# Patient Record
Sex: Female | Born: 1975 | Race: Black or African American | Hispanic: No | Marital: Married | Smoking: Former smoker
Health system: Southern US, Community
[De-identification: ages and names within clinical notes are randomized; demographics above are authoritative.]

## PROBLEM LIST (undated history)

## (undated) ENCOUNTER — Inpatient Hospital Stay (HOSPITAL_COMMUNITY): Payer: Self-pay

## (undated) DIAGNOSIS — D219 Benign neoplasm of connective and other soft tissue, unspecified: Secondary | ICD-10-CM

## (undated) DIAGNOSIS — E079 Disorder of thyroid, unspecified: Secondary | ICD-10-CM

## (undated) DIAGNOSIS — R011 Cardiac murmur, unspecified: Secondary | ICD-10-CM

## (undated) DIAGNOSIS — I10 Essential (primary) hypertension: Secondary | ICD-10-CM

## (undated) HISTORY — PX: NO PAST SURGERIES: SHX2092

---

## 2001-09-12 ENCOUNTER — Other Ambulatory Visit: Admission: RE | Admit: 2001-09-12 | Discharge: 2001-09-12 | Payer: Self-pay | Admitting: Obstetrics and Gynecology

## 2002-09-03 ENCOUNTER — Other Ambulatory Visit: Admission: RE | Admit: 2002-09-03 | Discharge: 2002-09-03 | Payer: Self-pay | Admitting: Obstetrics and Gynecology

## 2003-09-08 ENCOUNTER — Other Ambulatory Visit: Admission: RE | Admit: 2003-09-08 | Discharge: 2003-09-08 | Payer: Self-pay | Admitting: Obstetrics and Gynecology

## 2004-06-21 ENCOUNTER — Emergency Department (HOSPITAL_COMMUNITY): Admission: EM | Admit: 2004-06-21 | Discharge: 2004-06-21 | Payer: Self-pay | Admitting: Emergency Medicine

## 2004-11-17 ENCOUNTER — Other Ambulatory Visit: Admission: RE | Admit: 2004-11-17 | Discharge: 2004-11-17 | Payer: Self-pay | Admitting: Obstetrics and Gynecology

## 2011-08-16 ENCOUNTER — Emergency Department (HOSPITAL_COMMUNITY)
Admission: EM | Admit: 2011-08-16 | Discharge: 2011-08-16 | Disposition: A | Discharge status: Home / Self Care | Payer: No Typology Code available for payment source | Attending: Emergency Medicine | Admitting: Emergency Medicine

## 2011-08-16 ENCOUNTER — Encounter (HOSPITAL_COMMUNITY): Payer: Self-pay | Admitting: Emergency Medicine

## 2011-08-16 DIAGNOSIS — M549 Dorsalgia, unspecified: Secondary | ICD-10-CM | POA: Insufficient documentation

## 2011-08-16 DIAGNOSIS — Z79899 Other long term (current) drug therapy: Secondary | ICD-10-CM | POA: Insufficient documentation

## 2011-08-16 DIAGNOSIS — Y92410 Unspecified street and highway as the place of occurrence of the external cause: Secondary | ICD-10-CM | POA: Insufficient documentation

## 2011-08-16 DIAGNOSIS — S139XXA Sprain of joints and ligaments of unspecified parts of neck, initial encounter: Secondary | ICD-10-CM | POA: Insufficient documentation

## 2011-08-16 DIAGNOSIS — F17200 Nicotine dependence, unspecified, uncomplicated: Secondary | ICD-10-CM | POA: Insufficient documentation

## 2011-08-16 DIAGNOSIS — S134XXA Sprain of ligaments of cervical spine, initial encounter: Secondary | ICD-10-CM

## 2011-08-16 MED ORDER — IBUPROFEN 800 MG PO TABS: 800.0000 mg | ORAL_TABLET | Freq: Three times a day (TID) | ORAL | Status: AC

## 2011-08-16 MED ORDER — IBUPROFEN 800 MG PO TABS
800.0000 mg | ORAL_TABLET | Freq: Once | ORAL | Status: AC
  Administered 2011-08-16: 800 mg via ORAL
  Filled 2011-08-16: qty 1

## 2011-08-16 MED ORDER — DIAZEPAM 5 MG PO TABS: ORAL_TABLET | ORAL | Status: AC

## 2011-08-16 MED ORDER — ACETAMINOPHEN-CODEINE #3 300-30 MG PO TABS
1.0000 | ORAL_TABLET | Freq: Once | ORAL | Status: AC
  Administered 2011-08-16: 1 via ORAL
  Filled 2011-08-16: qty 1

## 2011-08-16 MED ORDER — ACETAMINOPHEN-CODEINE #3 300-30 MG PO TABS: 1.0000 | ORAL_TABLET | Freq: Four times a day (QID) | ORAL | Status: AC | PRN
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
## 2011-08-16 NOTE — ED Provider Notes (Signed)
Medical screening examination/treatment/procedure(s) were performed by non-physician practitioner and as supervising physician I was immediately available for consultation/collaboration.   Celene Kras, MD 08/16/11 [phone]

## 2011-09-19 ENCOUNTER — Emergency Department (HOSPITAL_COMMUNITY): Payer: Self-pay

## 2011-09-19 ENCOUNTER — Emergency Department (HOSPITAL_COMMUNITY)
Admission: EM | Admit: 2011-09-19 | Discharge: 2011-09-19 | Disposition: A | Discharge status: Home / Self Care | Payer: Self-pay | Attending: Emergency Medicine | Admitting: Emergency Medicine

## 2011-09-19 ENCOUNTER — Encounter (HOSPITAL_COMMUNITY): Payer: Self-pay | Admitting: *Deleted

## 2011-09-19 DIAGNOSIS — F17200 Nicotine dependence, unspecified, uncomplicated: Secondary | ICD-10-CM | POA: Insufficient documentation

## 2011-09-19 DIAGNOSIS — I10 Essential (primary) hypertension: Secondary | ICD-10-CM | POA: Insufficient documentation

## 2011-09-19 DIAGNOSIS — R51 Headache: Secondary | ICD-10-CM | POA: Insufficient documentation

## 2011-09-19 DIAGNOSIS — Z79899 Other long term (current) drug therapy: Secondary | ICD-10-CM | POA: Insufficient documentation

## 2011-09-19 DIAGNOSIS — R42 Dizziness and giddiness: Secondary | ICD-10-CM | POA: Insufficient documentation

## 2011-09-19 DIAGNOSIS — R110 Nausea: Secondary | ICD-10-CM | POA: Insufficient documentation

## 2011-09-19 HISTORY — DX: Cardiac murmur, unspecified: R01.1

## 2011-09-19 LAB — CBC WITH DIFFERENTIAL/PLATELET
Basophils Absolute: 0.1 10*3/uL (ref 0.0–0.1)
Eosinophils Relative: 4 % (ref 0–5)
HCT: 32.9 % — ABNORMAL LOW (ref 36.0–46.0)
Hemoglobin: 11.2 g/dL — ABNORMAL LOW (ref 12.0–15.0)
Lymphocytes Relative: 29 % (ref 12–46)
Lymphs Abs: 2.8 10*3/uL (ref 0.7–4.0)
MCV: 90.4 fL (ref 78.0–100.0)
Monocytes Absolute: 0.5 10*3/uL (ref 0.1–1.0)
Monocytes Relative: 5 % (ref 3–12)
Neutro Abs: 6 10*3/uL (ref 1.7–7.7)
RBC: 3.64 MIL/uL — ABNORMAL LOW (ref 3.87–5.11)
RDW: 12.6 % (ref 11.5–15.5)
WBC: 9.8 10*3/uL (ref 4.0–10.5)

## 2011-09-19 LAB — URINE MICROSCOPIC-ADD ON

## 2011-09-19 LAB — URINALYSIS, ROUTINE W REFLEX MICROSCOPIC
Glucose, UA: NEGATIVE mg/dL
Ketones, ur: NEGATIVE mg/dL
Leukocytes, UA: NEGATIVE
Protein, ur: NEGATIVE mg/dL
pH: 7.5 (ref 5.0–8.0)

## 2011-09-19 LAB — BASIC METABOLIC PANEL
BUN: 10 mg/dL (ref 6–23)
CO2: 25 mEq/L (ref 19–32)
Calcium: 9 mg/dL (ref 8.4–10.5)
Chloride: 103 mEq/L (ref 96–112)
Creatinine, Ser: 0.56 mg/dL (ref 0.50–1.10)
Glucose, Bld: 91 mg/dL (ref 70–99)

## 2011-09-19 MED ORDER — FLUCONAZOLE 200 MG PO TABS: 200.0000 mg | ORAL_TABLET | Freq: Every day | ORAL | Status: AC

## 2011-09-19 MED ORDER — NITROFURANTOIN MONOHYD MACRO 100 MG PO CAPS: 100.0000 mg | ORAL_CAPSULE | Freq: Two times a day (BID) | ORAL | Status: AC

## 2011-09-19 MED ORDER — LISINOPRIL 10 MG PO TABS
10.0000 mg | ORAL_TABLET | Freq: Once | ORAL | Status: AC
  Administered 2011-09-19: 10 mg via ORAL
  Filled 2011-09-19: qty 1

## 2011-09-19 MED ORDER — LISINOPRIL 10 MG PO TABS: 10.0000 mg | ORAL_TABLET | Freq: Every day | ORAL | Status: DC
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
## 2011-09-19 NOTE — ED Provider Notes (Addendum)
History     CSN: [id number]  Arrival date & time 09/19/11  1324   First MD Initiated Contact with Patient 09/19/11 1504      Chief Complaint  Patient presents with  . Hypertension  . Nausea  . Dizziness    (Consider location/radiation/quality/duration/timing/severity/associated sxs/prior treatment) HPI Comments: That she took Sudafed this morning due to nasal congestion and about 10 or 11:00 AM today she started to develop a headache that was global behind both eyes and did not radiate. She also states that she started to feel dizzy which she describes as a vertiginous symptoms. She states it was difficult to walk because of feeling dizzy but states the symptoms have resolved. She denied any speech difficulties or focal weakness.  Patient is a 36 y.o. female presenting with hypertension. The history is provided by the patient.  Hypertension This is a new problem. Episode onset: very high today but 1 month ago had it checked and states it was high. The problem occurs constantly. The problem has been gradually improving. Associated symptoms include headaches. Pertinent negatives include no chest pain, no abdominal pain and no shortness of breath. Nothing aggravates the symptoms. Nothing relieves the symptoms. She has tried nothing for the symptoms. The treatment provided mild relief.    Past Medical History  Diagnosis Date  . Heart murmur     History reviewed. No pertinent past surgical history.  Family History  Problem Relation Age of Onset  . Diabetes Mother   . Cancer Mother   . Hypertension Mother     History  Substance Use Topics  . Smoking status: Current Some Day Smoker  . Smokeless tobacco: Not on file  . Alcohol Use: Yes    OB History    Grav Para Term Preterm Abortions TAB SAB Ect Mult Living                  Review of Systems  Constitutional: Negative for fever and fatigue.  Respiratory: Negative for cough and shortness of breath.   Cardiovascular:  Negative for chest pain and leg swelling.  Gastrointestinal: Negative for nausea, vomiting and abdominal pain.  Neurological: Positive for dizziness and headaches. Negative for syncope, facial asymmetry, speech difficulty, weakness and numbness.  All other systems reviewed and are negative.    Allergies  Aspirin and Codeine  Home Medications   Current Outpatient Rx  Name Route Sig Dispense Refill  . CARISOPRODOL 350 MG PO TABS Oral Take 350 mg by mouth 4 (four) times daily as needed. For muscle spasms    . FLUOCINONIDE 0.05 % EX CREA Topical Apply 1 application topically 2 (two) times daily as needed. For eczema    . HYDROQUINONE 4 % EX CREA Topical Apply 1 application topically daily.    . IBUPROFEN 800 MG PO TABS Oral Take 800 mg by mouth every 8 (eight) hours as needed. For pain in neck and back    . PSEUDOEPHEDRINE HCL 60 MG PO TABS Oral Take 60 mg by mouth every 4 (four) hours as needed. For sinus pressure and headache    . TRAMADOL HCL 50 MG PO TABS Oral Take 50-100 mg by mouth every 6 (six) hours as needed. For pain    . TRETINOIN 0.025 % EX GEL Topical Apply 1 application topically daily.      BP 164/105  Pulse 63  Temp 98.2 F (36.8 C) (Oral)  Resp 16  SpO2 100%  LMP 09/16/2011  Physical Exam  Nursing note and vitals  reviewed. Constitutional: She is oriented to person, place, and time. She appears well-developed and well-nourished. No distress.  HENT:  Head: Normocephalic and atraumatic.  Mouth/Throat: Oropharynx is clear and moist.  Eyes: Conjunctivae and EOM are normal. Pupils are equal, round, and reactive to light.       No photophobia  Neck: Normal range of motion. Neck supple. Carotid bruit is not present.  Cardiovascular: Normal rate, regular rhythm and intact distal pulses.   No murmur heard. Pulmonary/Chest: Effort normal and breath sounds normal. No respiratory distress. She has no wheezes. She has no rales.  Abdominal: Soft. She exhibits no distension.  There is no tenderness. There is no rebound and no guarding.  Musculoskeletal: Normal range of motion. She exhibits no edema and no tenderness.  Neurological: She is alert and oriented to person, place, and time. She has normal strength. No cranial nerve deficit or sensory deficit. Coordination and gait normal.  Skin: Skin is warm and dry. No rash noted. No erythema.  Psychiatric: She has a normal mood and affect. Her behavior is normal.    ED Course  Procedures (including critical care time)  Labs Reviewed  CBC WITH DIFFERENTIAL - Abnormal; Notable for the following:    RBC 3.64 (*)     Hemoglobin 11.2 (*)     HCT 32.9 (*)     All other components within normal limits  BASIC METABOLIC PANEL - Abnormal; Notable for the following:    Potassium 3.4 (*)     All other components within normal limits  URINALYSIS, ROUTINE W REFLEX MICROSCOPIC - Abnormal; Notable for the following:    APPearance CLOUDY (*)     Hgb urine dipstick TRACE (*)     All other components within normal limits  URINE MICROSCOPIC-ADD ON - Abnormal; Notable for the following:    Squamous Epithelial / LPF FEW (*)     Bacteria, UA FEW (*)     All other components within normal limits  POCT PREGNANCY, URINE     Date: 10/08/2011  Rate: 74  Rhythm: normal sinus rhythm  QRS Axis: normal  Intervals: normal  ST/T Wave abnormalities: normal  Conduction Disutrbances: none  Narrative Interpretation: unremarkable    Ct Head Wo Contrast  09/19/2011  *RADIOLOGY REPORT*  Clinical Data: Headache and dizziness.  CT HEAD WITHOUT CONTRAST  Technique:  Contiguous axial images were obtained from the base of the skull through the vertex without contrast.  Comparison: None.  Findings: There is no acute intracranial hemorrhage, infarction, or mass lesion.  Brain parenchyma is normal.  Osseous structures are normal.  IMPRESSION: Normal exam.  Original Report Authenticated By: Gwynn Burly, M.D.     1. Hypertension   2. Headache        MDM   History of mildly elevated blood pressure which she currently was not requiring treatment 4. This morning she took Sudafed due to some nasal congestion and later in the morning she started to have a headache and dizziness which she describes as mild vertigo. She denies any blurred vision but states that she's had a persistent headache and checked her blood pressure today and it was 170/100. She does not take any medications her blood pressure in she does not remember what her blood pressure was at her prior appointment. Neurologic exam is within normal limits. However given her complaints of dizziness and difficulty walking earlier will get a CT to rule out any other abnormal pathology. Currently her gait is normal. CBC, BMP, UA,  UPT, head CT pending. Patient given lisinopril.  4:55 PM Labs are within normal limits. UA with bacteria but no white blood cells, leukocytes or nitrites however with patient's urinary frequency and urgency will give her prescription if things worsen she can start antibiotics. After 10 mg of lisinopril her blood pressure is improved 158/85. She states her headache is much improved. Will discharge patient home.      Gwyneth Sprout, MD 09/19/11 1656  Gwyneth Sprout, MD 09/19/11 1658  Gwyneth Sprout, MD 10/08/11 [phone]

## 2013-08-13 IMAGING — CT CT HEAD W/O CM
2 series · 16 of 30 positions shown, 20 images · non-contrast
Comparison: None.

CLINICAL DATA: Headache and dizziness.

CT HEAD WITHOUT CONTRAST
TECHNIQUE: Contiguous axial images were obtained from the base of
the skull through the vertex without contrast.

[Series 2: head w/o · axial · non-contrast · 0.43mm/px · z∈[+1334,+1454]mm · 13 of 29 slices shown, 17 images]
[im 3/29  brain]
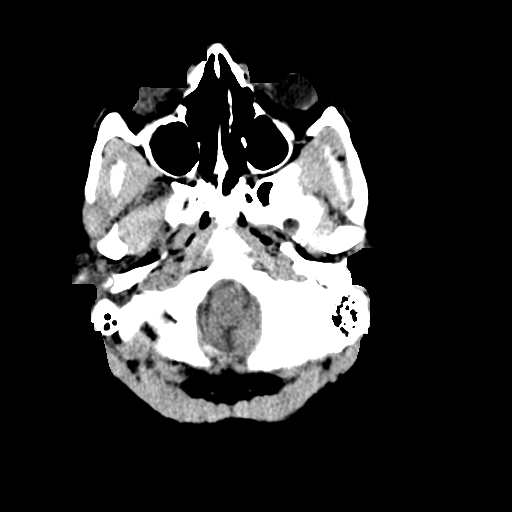
[im 3/29  bone]
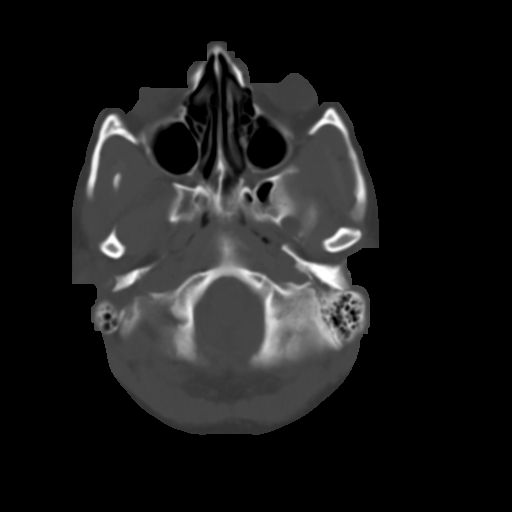
[im 5/29  brain]
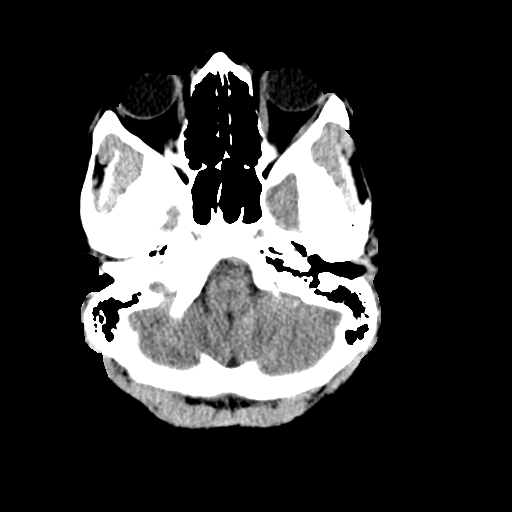
[im 7/29  brain]
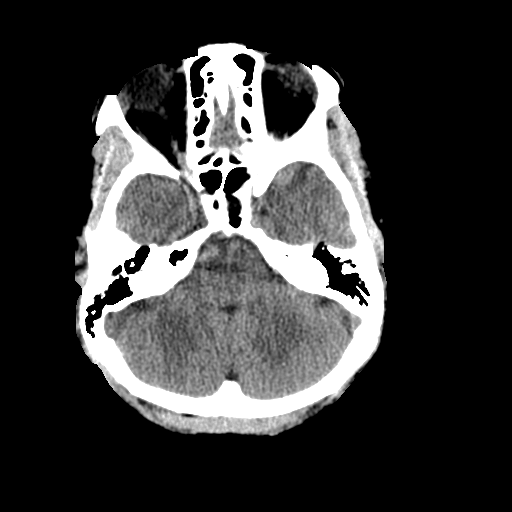
[im 9/29  brain]
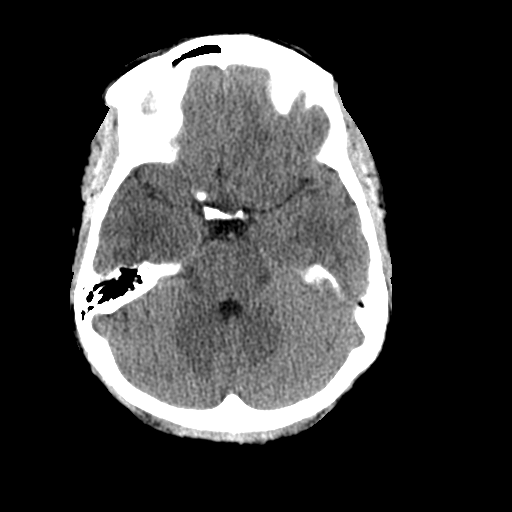
[im 11/29  brain]
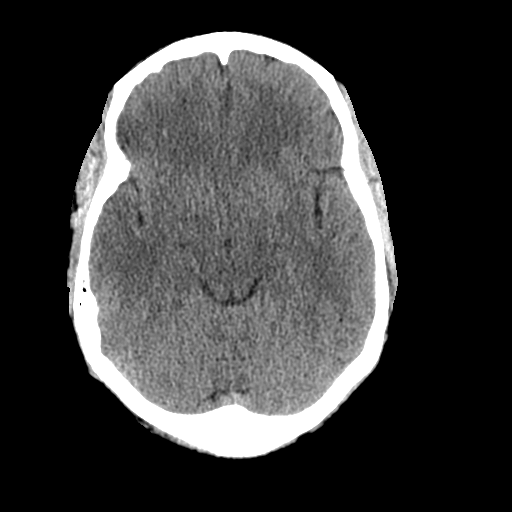
[im 11/29  bone]
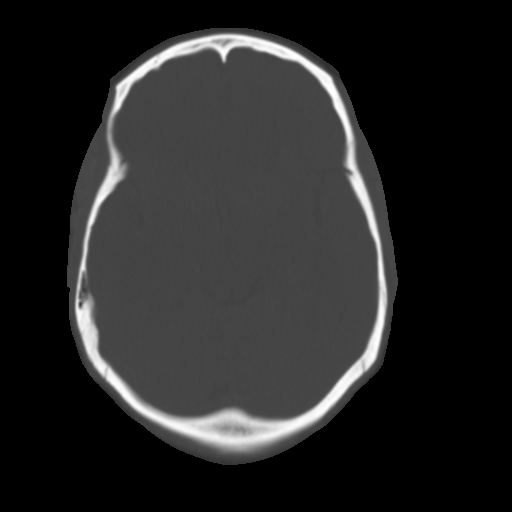
[im 13/29  brain]
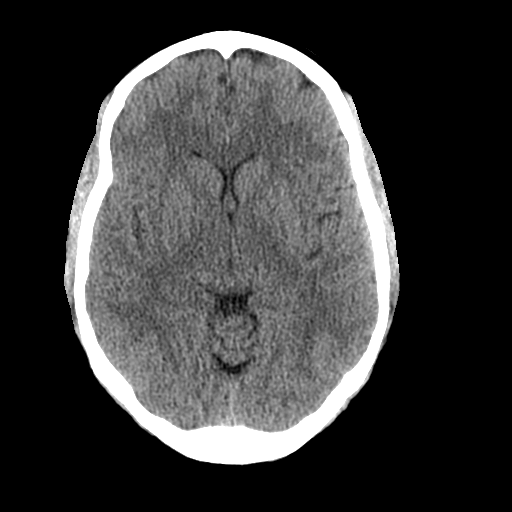
[im 15/29  brain]
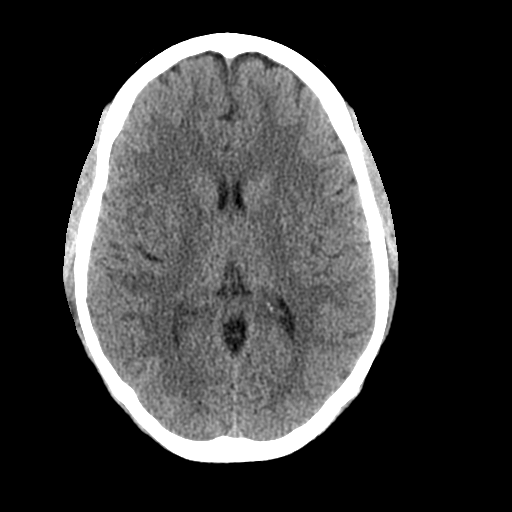
[im 17/29  brain]
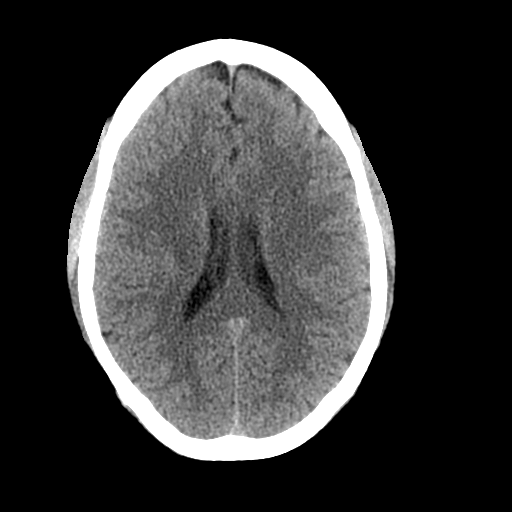
[im 19/29  brain]
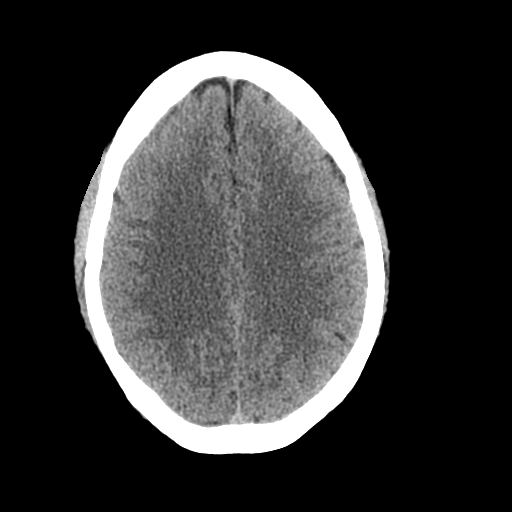
[im 19/29  bone]
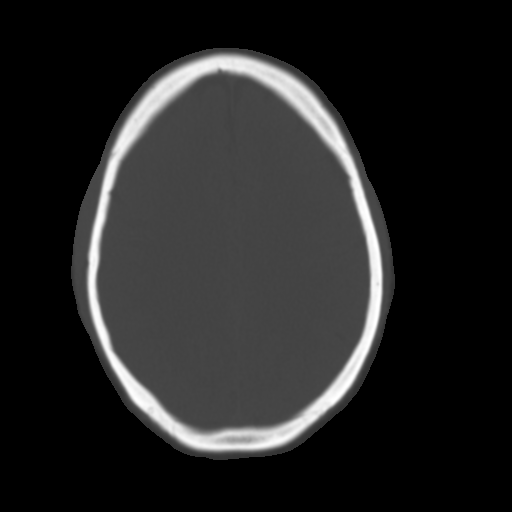
[im 21/29  brain]
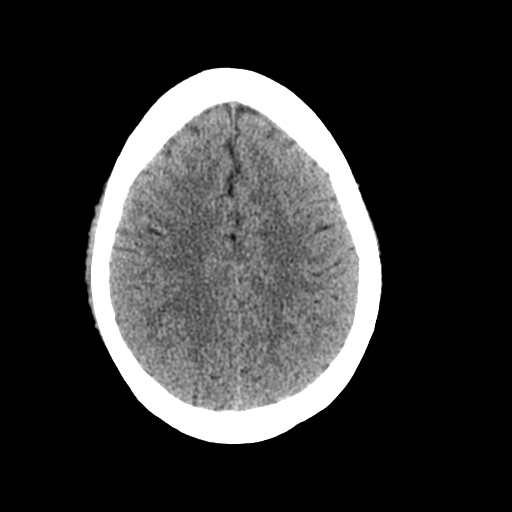
[im 23/29  brain]
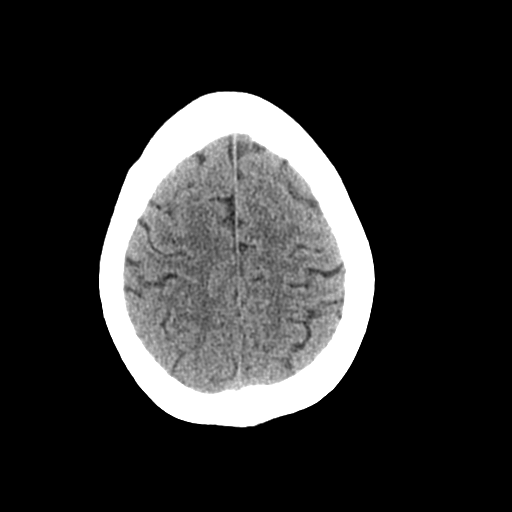
[im 25/29  brain]
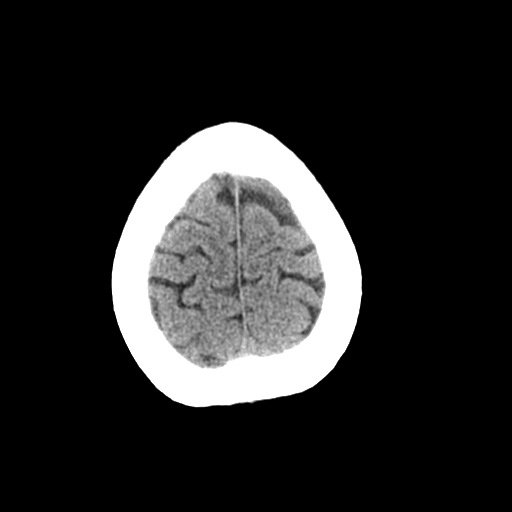
[im 27/29  brain]
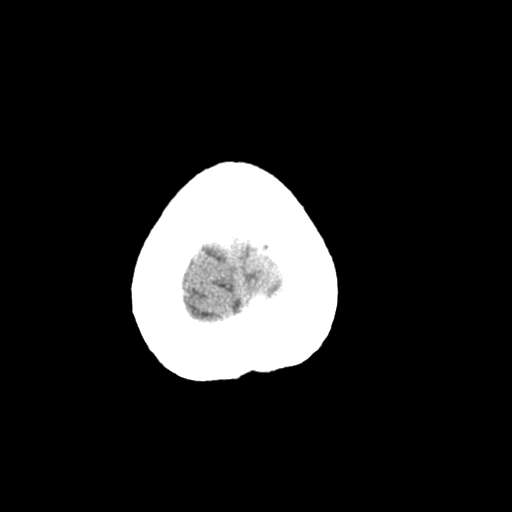
[im 27/29  bone]
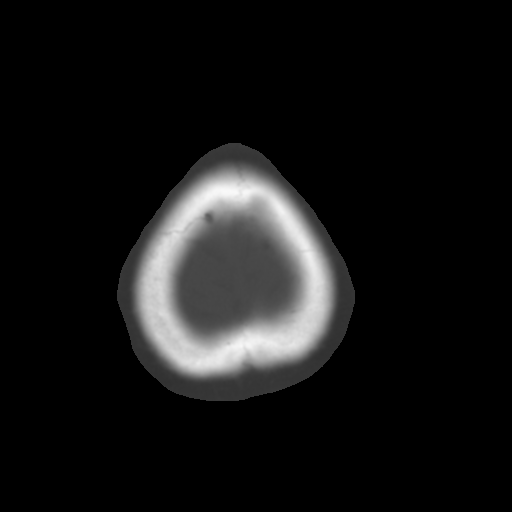

[Series 3: bone windows · axial · 0.43mm/px · z∈[+1334,+1374]mm · 3 of 29 slices shown]
[im 3/29  bone]
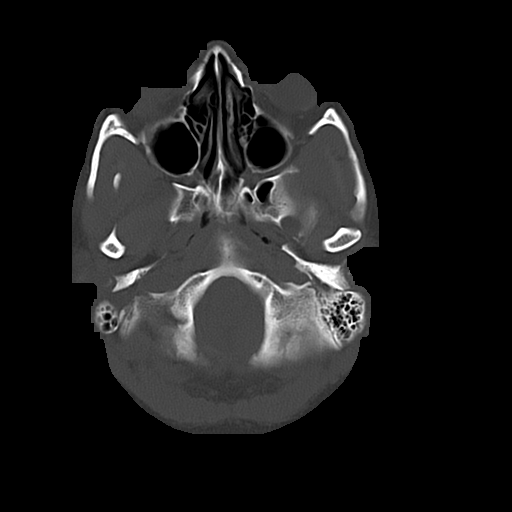
[im 7/29  bone]
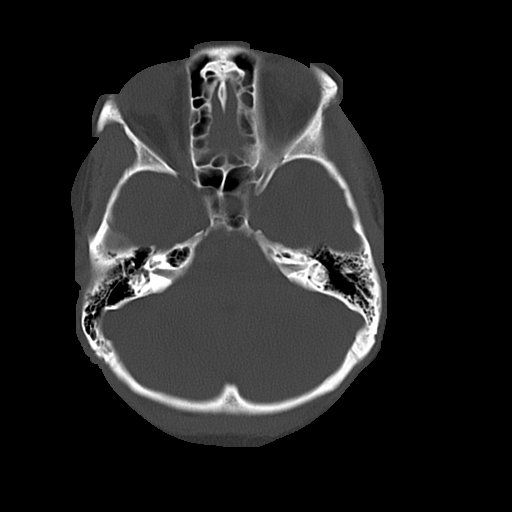
[im 11/29  bone]
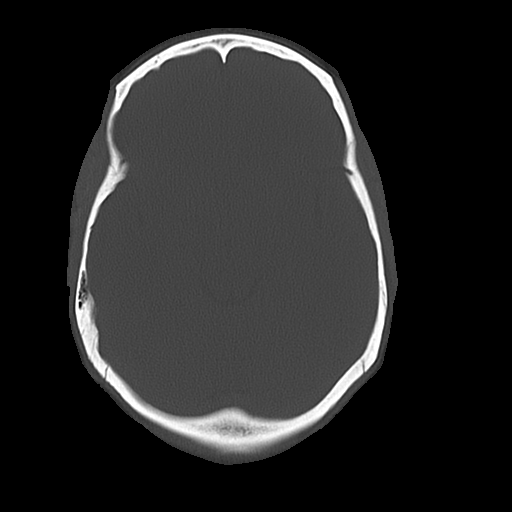

[16 of 30 positions shown; findings below may reference images not displayed]

FINDINGS: There is no acute intracranial hemorrhage, infarction, or
mass lesion.  Brain parenchyma is normal.  Osseous structures are
normal.
IMPRESSION: Normal exam.

## 2014-11-17 ENCOUNTER — Ambulatory Visit (HOSPITAL_COMMUNITY)
Admission: RE | Admit: 2014-11-17 | Discharge: 2014-11-17 | Disposition: A | Discharge status: Home / Self Care | Payer: No Typology Code available for payment source | Source: Ambulatory Visit | Attending: Obstetrics and Gynecology | Admitting: Obstetrics and Gynecology

## 2014-11-17 DIAGNOSIS — O09512 Supervision of elderly primigravida, second trimester: Secondary | ICD-10-CM

## 2014-11-17 DIAGNOSIS — Z3A17 17 weeks gestation of pregnancy: Secondary | ICD-10-CM

## 2014-11-19 ENCOUNTER — Other Ambulatory Visit (HOSPITAL_COMMUNITY): Payer: Self-pay | Admitting: Obstetrics and Gynecology

## 2014-11-19 ENCOUNTER — Encounter (HOSPITAL_COMMUNITY): Payer: Self-pay | Admitting: Obstetrics and Gynecology

## 2014-11-20 DIAGNOSIS — Z3A17 17 weeks gestation of pregnancy: Secondary | ICD-10-CM | POA: Insufficient documentation

## 2014-11-20 DIAGNOSIS — O09519 Supervision of elderly primigravida, unspecified trimester: Secondary | ICD-10-CM | POA: Insufficient documentation

## 2014-11-20 NOTE — Progress Notes (Signed)
Genetic Counseling  High-Risk Gestation Note  Appointment Date: 11/17/14 Referred By: Dian Queen, MD Date of Birth:  1975/10/26  Pregnancy History: GPO Estimated Date of Delivery: 04/25/15 Estimated Gestational Age: [redacted]w[redacted]d Attending: Viann Fish, MD  [name] was seen for genetic counseling because of a maternal age of 39.  [name]' pregnancy is a dichorionic-diamniotic twin pregnancy.  In summary:  Discussed AMA and risks for aneuploidy  Reviewed failed NIPS x2 including causes  Patient elected to have ultrasound at her referring provider's office  Declined Quad screening and amniocentesis  She was counseled regarding maternal age and the association with risk for chromosome conditions due to nondisjunction with aging of the ova.  We reviewed chromosomes, nondisjunction, and the associated 1 in 23 risk for fetal aneuploidy in each fetus related to a maternal age of 39 y.o. at [redacted]w[redacted]d gestation. She was counseled that the risk for aneuploidy decreases as gestational age increases, accounting for those pregnancies which spontaneously abort.  We specifically discussed Down syndrome (trisomy 31), trisomies 1 and 37, and sex chromosome aneuploidies (47,XXX and 47,XXY) including the common features and prognoses of each.   The patient was offered noninvasive prenatal screening (NIPS) at her referring provider's office. The patient had Harmony screening (twin pregnancy). No results were obtained due to a low percentage of fetal DNA in the sample. The patient had repeat testing and again was found to have a low percentage of fetal DNA; no result was obtained. We discussed that literature suggests that the risk of fetal aneuploidy is increased when the percentage of fetal DNA is too low for a result to be obtained. This risk is estimated to range from 2-4%. Specifically, we discussed that fetuses with trisomy 72, trisomy 75, triploidy, and monosomy X tend to have smaller  placentas and therefore the percentage of cell free fetal (which is actually placental) DNA tends to be less in maternal circulation, as compared to a euploid pregnancy. Additionally, we discussed that maternal use of specific medications is thought to influence the percentage of cell free fetal (placental) DNA in maternal circulation.   We reviewed other available screening options including Quad screening and detailed ultrasound. She was counseled that screening tests are used to modify a patient's a priori risk for aneuploidy, typically based on age. This estimate provides a pregnancy specific risk assessment. We reviewed the benefits and limitations of each option. Specifically, we discussed the conditions for which each test screens, the detection rates (for twins), and false positive rates of each. They were also counseled regarding diagnostic testing via amniocentesis. We reviewed the approximate 1 in 161-096 risk for complications for amniocentesis, including spontaneous pregnancy loss. After consideration of these options, she expressed interest in having a detailed ultrasound. This appointment is scheduled at the patient's referring provider's office. Amniocentesis and Quad screening were declined today. They understand that screening tests cannot rule out all birth defects or genetic syndromes. The patient was advised of this limitation and states she still does not want additional testing at this time.   [name] was provided with written information regarding sickle cell anemia (SCA) including the carrier frequency and incidence in the African-American population, the availability of carrier testing and prenatal diagnosis if indicated.  In addition, we discussed that hemoglobinopathies are routinely screened for as part of the [place] newborn screening panel.  She declined hemoglobin electrophoresis today.  Both family histories were reviewed and found to be noncontributory for birth defects,  intellectual disability, and known genetic conditions.  Without further information regarding the provided family history, an accurate genetic risk cannot be calculated. Further genetic counseling is warranted if more information is obtained.  [name] denied exposure to environmental toxins or chemical agents. She denied the use of alcohol, tobacco or street drugs. She denied significant viral illnesses during the course of her pregnancy. Her medical and surgical histories were noncontributory.   I counseled Ms. Erickson regarding the above risks and available options. The approximate face-to-face time with the genetic counselor was 36 minutes.  Sharyne Richters, MS Certified Genetic Counselor

## 2014-12-04 ENCOUNTER — Other Ambulatory Visit (HOSPITAL_COMMUNITY): Payer: Self-pay | Admitting: Obstetrics and Gynecology

## 2014-12-04 DIAGNOSIS — O289 Unspecified abnormal findings on antenatal screening of mother: Secondary | ICD-10-CM

## 2014-12-08 ENCOUNTER — Ambulatory Visit (HOSPITAL_COMMUNITY): Admission: RE | Admit: 2014-12-08 | Payer: No Typology Code available for payment source | Source: Ambulatory Visit

## 2014-12-08 ENCOUNTER — Ambulatory Visit (HOSPITAL_COMMUNITY): Payer: No Typology Code available for payment source | Attending: Obstetrics and Gynecology

## 2014-12-31 ENCOUNTER — Encounter (HOSPITAL_COMMUNITY): Payer: Self-pay

## 2014-12-31 ENCOUNTER — Ambulatory Visit (HOSPITAL_COMMUNITY)
Admission: RE | Admit: 2014-12-31 | Discharge: 2014-12-31 | Disposition: A | Discharge status: Home / Self Care | Payer: No Typology Code available for payment source | Source: Ambulatory Visit | Attending: Obstetrics and Gynecology | Admitting: Obstetrics and Gynecology

## 2014-12-31 ENCOUNTER — Other Ambulatory Visit (HOSPITAL_COMMUNITY): Payer: Self-pay | Admitting: Obstetrics and Gynecology

## 2014-12-31 ENCOUNTER — Ambulatory Visit (HOSPITAL_COMMUNITY): Admission: RE | Admit: 2014-12-31 | Payer: No Typology Code available for payment source | Source: Ambulatory Visit

## 2014-12-31 ENCOUNTER — Inpatient Hospital Stay (HOSPITAL_COMMUNITY)
Admission: AD | Admit: 2014-12-31 | Discharge: 2015-01-02 | DRG: 781 | Disposition: A | Discharge status: Home / Self Care | Payer: No Typology Code available for payment source | Source: Ambulatory Visit | Attending: Obstetrics and Gynecology | Admitting: Obstetrics and Gynecology

## 2014-12-31 ENCOUNTER — Encounter (HOSPITAL_COMMUNITY): Payer: Self-pay | Admitting: *Deleted

## 2014-12-31 DIAGNOSIS — N883 Incompetence of cervix uteri: Secondary | ICD-10-CM | POA: Diagnosis present

## 2014-12-31 DIAGNOSIS — D259 Leiomyoma of uterus, unspecified: Secondary | ICD-10-CM

## 2014-12-31 DIAGNOSIS — O3412 Maternal care for benign tumor of corpus uteri, second trimester: Secondary | ICD-10-CM

## 2014-12-31 DIAGNOSIS — I429 Cardiomyopathy, unspecified: Secondary | ICD-10-CM | POA: Diagnosis present

## 2014-12-31 DIAGNOSIS — O289 Unspecified abnormal findings on antenatal screening of mother: Secondary | ICD-10-CM

## 2014-12-31 DIAGNOSIS — O30042 Twin pregnancy, dichorionic/diamniotic, second trimester: Secondary | ICD-10-CM | POA: Diagnosis present

## 2014-12-31 DIAGNOSIS — O10912 Unspecified pre-existing hypertension complicating pregnancy, second trimester: Secondary | ICD-10-CM | POA: Diagnosis present

## 2014-12-31 DIAGNOSIS — O09219 Supervision of pregnancy with history of pre-term labor, unspecified trimester: Secondary | ICD-10-CM

## 2014-12-31 DIAGNOSIS — Z3A23 23 weeks gestation of pregnancy: Secondary | ICD-10-CM

## 2014-12-31 DIAGNOSIS — O10019 Pre-existing essential hypertension complicating pregnancy, unspecified trimester: Secondary | ICD-10-CM

## 2014-12-31 DIAGNOSIS — O99512 Diseases of the respiratory system complicating pregnancy, second trimester: Secondary | ICD-10-CM | POA: Diagnosis present

## 2014-12-31 DIAGNOSIS — O09512 Supervision of elderly primigravida, second trimester: Secondary | ICD-10-CM

## 2014-12-31 DIAGNOSIS — O26872 Cervical shortening, second trimester: Secondary | ICD-10-CM | POA: Diagnosis present

## 2014-12-31 DIAGNOSIS — Z3689 Encounter for other specified antenatal screening: Secondary | ICD-10-CM

## 2014-12-31 DIAGNOSIS — O09212 Supervision of pregnancy with history of pre-term labor, second trimester: Secondary | ICD-10-CM | POA: Diagnosis not present

## 2014-12-31 DIAGNOSIS — J811 Chronic pulmonary edema: Secondary | ICD-10-CM | POA: Diagnosis present

## 2014-12-31 HISTORY — DX: Benign neoplasm of connective and other soft tissue, unspecified: D21.9

## 2014-12-31 HISTORY — DX: Essential (primary) hypertension: I10

## 2014-12-31 LAB — URINALYSIS, ROUTINE W REFLEX MICROSCOPIC
Bilirubin Urine: NEGATIVE
GLUCOSE, UA: NEGATIVE mg/dL
Ketones, ur: NEGATIVE mg/dL
LEUKOCYTES UA: NEGATIVE
NITRITE: NEGATIVE
PH: 7.5 (ref 5.0–8.0)
Protein, ur: NEGATIVE mg/dL
SPECIFIC GRAVITY, URINE: 1.02 (ref 1.005–1.030)
Urobilinogen, UA: 0.2 mg/dL (ref 0.0–1.0)

## 2014-12-31 LAB — CBC
HCT: 28.8 % — ABNORMAL LOW (ref 36.0–46.0)
HEMOGLOBIN: 9.5 g/dL — AB (ref 12.0–15.0)
MCH: 29.3 pg (ref 26.0–34.0)
MCHC: 33 g/dL (ref 30.0–36.0)
MCV: 88.9 fL (ref 78.0–100.0)
PLATELETS: 342 10*3/uL (ref 150–400)
RBC: 3.24 MIL/uL — AB (ref 3.87–5.11)
RDW: 14 % (ref 11.5–15.5)
WBC: 16.8 10*3/uL — AB (ref 4.0–10.5)

## 2014-12-31 LAB — URINE MICROSCOPIC-ADD ON

## 2014-12-31 LAB — ABO/RH: ABO/RH(D): O POS

## 2014-12-31 LAB — TYPE AND SCREEN
ABO/RH(D): O POS
ANTIBODY SCREEN: NEGATIVE

## 2014-12-31 MED ORDER — CALCIUM CARBONATE ANTACID 500 MG PO CHEW
2.0000 | CHEWABLE_TABLET | ORAL | Status: DC | PRN
Start: 2014-12-31 — End: 2015-01-02

## 2014-12-31 MED ORDER — ACETAMINOPHEN 325 MG PO TABS
650.0000 mg | ORAL_TABLET | ORAL | Status: DC | PRN
  Administered 2015-01-01: 650 mg via ORAL
  Filled 2014-12-31: qty 2

## 2014-12-31 MED ORDER — PROGESTERONE MICRONIZED 200 MG PO CAPS
200.0000 mg | ORAL_CAPSULE | Freq: Every day | ORAL | Status: DC
  Administered 2014-12-31 – 2015-01-01 (×2): 200 mg via VAGINAL
  Filled 2014-12-31 (×2): qty 1

## 2014-12-31 MED ORDER — DOCUSATE SODIUM 100 MG PO CAPS
100.0000 mg | ORAL_CAPSULE | Freq: Every day | ORAL | Status: DC
  Administered 2014-12-31 – 2015-01-02 (×3): 100 mg via ORAL
  Filled 2014-12-31 (×3): qty 1

## 2014-12-31 MED ORDER — ZOLPIDEM TARTRATE 5 MG PO TABS
5.0000 mg | ORAL_TABLET | Freq: Every evening | ORAL | Status: DC | PRN
  Administered 2014-12-31 – 2015-01-01 (×2): 5 mg via ORAL
  Filled 2014-12-31 (×2): qty 1

## 2014-12-31 MED ORDER — NIFEDIPINE 10 MG PO CAPS
10.0000 mg | ORAL_CAPSULE | Freq: Three times a day (TID) | ORAL | Status: DC | PRN
  Administered 2014-12-31: 10 mg via ORAL
  Filled 2014-12-31: qty 1

## 2014-12-31 MED ORDER — PRENATAL MULTIVITAMIN CH
1.0000 | ORAL_TABLET | Freq: Every day | ORAL | Status: DC
  Administered 2015-01-01 – 2015-01-02 (×2): 1 via ORAL
  Filled 2014-12-31 (×2): qty 1

## 2014-12-31 MED ORDER — TERBUTALINE SULFATE 1 MG/ML IJ SOLN
0.2500 mg | Freq: Once | INTRAMUSCULAR | Status: AC
  Administered 2014-12-31: 0.25 mg via SUBCUTANEOUS
  Filled 2014-12-31: qty 1

## 2014-12-31 MED ORDER — BETAMETHASONE SOD PHOS & ACET 6 (3-3) MG/ML IJ SUSP
12.0000 mg | INTRAMUSCULAR | Status: AC
  Administered 2014-12-31 – 2015-01-01 (×2): 12 mg via INTRAMUSCULAR
  Filled 2014-12-31 (×2): qty 2

## 2014-12-31 NOTE — ED Notes (Signed)
Report called to Rush Farmer, Therapist, sports.

## 2014-12-31 NOTE — MAU Note (Addendum)
Pt was sent from [facility] for shortening of cervix.  Pt states she was sent over for BMZ and progesterone to be started.  Denies vaginal bleeding or abnormal discharge. No ROM.  Good fetal movement.  Pt a direct admit to room 151 after L. Clemmons, CNM spoke with MD who sent her over.(Dr. Decker/Dr. Runell Gess).

## 2014-12-31 NOTE — H&P (Signed)
[name]  DICTATION # [id number] CSN# [id number]   Margarette Asal, MD 12/31/2014 5:59 PM

## 2014-12-31 NOTE — ED Notes (Signed)
Pt to MAU for further evaluation.

## 2014-12-31 NOTE — H&P (Deleted)
[name], Cindy Erickson            ACCOUNT NO.:  [id number]  MEDICAL RECORD NO.:  [id number]  LOCATION:  9151                          FACILITY:  [facility]  PHYSICIAN:  Ralene Bathe. Matthew Saras, M.D.DATE OF BIRTH:  February 09, 1976  DATE OF ADMISSION:  12/31/2014 DATE OF DISCHARGE:                             HISTORY & PHYSICAL   CHIEF COMPLAINT:  Short cervix, twin pregnancy.   DICTATION ENDS HERE.     Anniemae Haberkorn M. Matthew Saras, M.D.     [facility]/MEDQ  D:  12/31/2014  T:  12/31/2014  Job:  [id number]

## 2015-01-01 MED ORDER — BISACODYL 10 MG RE SUPP
10.0000 mg | Freq: Once | RECTAL | Status: AC
  Administered 2015-01-01: 10 mg via RECTAL
  Filled 2015-01-01: qty 1

## 2015-01-01 MED ORDER — NIFEDIPINE 10 MG PO CAPS
10.0000 mg | ORAL_CAPSULE | Freq: Four times a day (QID) | ORAL | Status: DC
  Administered 2015-01-01 – 2015-01-02 (×5): 10 mg via ORAL
  Filled 2015-01-01 (×5): qty 1

## 2015-01-01 MED ORDER — POLYETHYLENE GLYCOL 3350 17 G PO PACK
17.0000 g | PACK | Freq: Every day | ORAL | Status: DC
  Administered 2015-01-01 – 2015-01-02 (×2): 17 g via ORAL
  Filled 2015-01-01 (×2): qty 1

## 2015-01-01 NOTE — Progress Notes (Signed)
Pt c/o constipation, no BM in 2 days, requesting med.

## 2015-01-01 NOTE — H&P (Signed)
NAMEDIETRICH, KE            ACCOUNT NO.:  [id number]  MEDICAL RECORD NO.:  [id number]  LOCATION:  9151                          FACILITY:  [facility]  PHYSICIAN:  Ralene Bathe. Matthew Saras, M.D.DATE OF BIRTH:  10-04-1975  DATE OF ADMISSION:  12/31/2014 DATE OF DISCHARGE:                             HISTORY & PHYSICAL   CHIEF COMPLAINT:  Short cervix.  HISTORY OF PRESENT ILLNESS:  A 39 year old, G1, P0, twins gestation, diamniotic dichorionic.  She has a history of fibroid.  Was previously on a low-dose HCTZ for chronic hypertension which she has discontinued and her blood pressure has been reasonably normal.  She did have Harmony testing done for trisomy evaluation that initially did not grow enough material.  This was repeated.  The test came back suspicious for possible trisomy 19 with a risk of 1/33.  She presented to the MFM today for further evaluation, anatomy screen and to further evaluate for possible further studies.  Received a phone call from Dr. Burnett Harry saying that during a routine scan, her cervix was noted to be only 4 mm where previously it had been approximately 3 cm.  She presents now for further evaluation for labor and to receive her betamethasone series.  PAST MEDICAL HISTORY:  Blood type is O positive.  For the remainder of her history, please see the [facility] form for details.  ALLERGIES:  Percocet.  PHYSICAL EXAMINATION:  VITAL SIGNS:  Temp 98.3, blood pressure 120/82. HEENT:  Unremarkable. NECK:  Supple without masses. LUNGS:  Clear. CARDIOVASCULAR:  Regular rate and rhythm without murmurs, rubs, gallops. BREASTS:  Not examined. PELVIC:  26 cm fundal height.  Clinically, the cervix was not examined. EXTREMITIES:  Unremarkable. NEUROLOGIC:  Unremarkable.  IMPRESSION: 1. Diamniotic dichorionic twins. 2. Increased trisomy 21 risk. 3. History of fibroids and mild hypertension, not currently on     medicines. 4. Shortened cervix noted, EGA approximately 24  weeks.  PLAN:  We will admit for observation of labor, betamethasone series.     Sharonlee Nine M. Matthew Saras, M.D.     RM[facility]/MEDQ  D:  12/31/2014  T:  12/31/2014  Job:  [id number]

## 2015-01-01 NOTE — Progress Notes (Signed)
Pt feeling better.  No ctx or cramps.  No vb.  Good FM x 2  AF, VSS + FHT x 2 Toco - occ irritability Cvx deferred  A/P:  Twins, short cvx Continue BMZ, bedrest Procardia, progesterone Rpt TVUS for cvx length tomorrow to eval for home bedrest

## 2015-01-02 ENCOUNTER — Inpatient Hospital Stay (HOSPITAL_COMMUNITY): Payer: No Typology Code available for payment source

## 2015-01-02 DIAGNOSIS — N883 Incompetence of cervix uteri: Secondary | ICD-10-CM | POA: Diagnosis present

## 2015-01-02 MED ORDER — PROGESTERONE MICRONIZED 200 MG PO CAPS: 200.0000 mg | ORAL_CAPSULE | Freq: Every day | ORAL | Status: DC

## 2015-01-02 MED ORDER — DOCUSATE SODIUM 100 MG PO CAPS: 100.0000 mg | ORAL_CAPSULE | Freq: Every day | ORAL | Status: DC

## 2015-01-02 MED ORDER — NIFEDIPINE 10 MG PO CAPS: 10.0000 mg | ORAL_CAPSULE | Freq: Four times a day (QID) | ORAL | Status: DC | PRN

## 2015-01-02 NOTE — Progress Notes (Signed)
Patient discharged per wheelchair with family at side. Patient provided teachback instructions and will follow up on Monday with care provider. Patient aware of s/s of ptl

## 2015-01-03 ENCOUNTER — Inpatient Hospital Stay (HOSPITAL_COMMUNITY)
Admission: AD | Admit: 2015-01-03 | Discharge: 2015-01-03 | Disposition: A | Discharge status: Home / Self Care | Payer: No Typology Code available for payment source | Source: Ambulatory Visit | Attending: Obstetrics and Gynecology | Admitting: Obstetrics and Gynecology

## 2015-01-03 ENCOUNTER — Encounter (HOSPITAL_COMMUNITY): Payer: Self-pay | Admitting: *Deleted

## 2015-01-03 DIAGNOSIS — O09519 Supervision of elderly primigravida, unspecified trimester: Secondary | ICD-10-CM

## 2015-01-03 DIAGNOSIS — O26872 Cervical shortening, second trimester: Secondary | ICD-10-CM | POA: Insufficient documentation

## 2015-01-03 DIAGNOSIS — Z87891 Personal history of nicotine dependence: Secondary | ICD-10-CM | POA: Diagnosis not present

## 2015-01-03 DIAGNOSIS — O09512 Supervision of elderly primigravida, second trimester: Secondary | ICD-10-CM

## 2015-01-03 DIAGNOSIS — O30042 Twin pregnancy, dichorionic/diamniotic, second trimester: Secondary | ICD-10-CM | POA: Insufficient documentation

## 2015-01-03 DIAGNOSIS — N898 Other specified noninflammatory disorders of vagina: Secondary | ICD-10-CM | POA: Diagnosis present

## 2015-01-03 DIAGNOSIS — O4692 Antepartum hemorrhage, unspecified, second trimester: Secondary | ICD-10-CM | POA: Diagnosis not present

## 2015-01-03 DIAGNOSIS — O09212 Supervision of pregnancy with history of pre-term labor, second trimester: Secondary | ICD-10-CM

## 2015-01-03 DIAGNOSIS — Z3A24 24 weeks gestation of pregnancy: Secondary | ICD-10-CM | POA: Insufficient documentation

## 2015-01-03 DIAGNOSIS — N883 Incompetence of cervix uteri: Secondary | ICD-10-CM | POA: Diagnosis not present

## 2015-01-03 DIAGNOSIS — O30049 Twin pregnancy, dichorionic/diamniotic, unspecified trimester: Secondary | ICD-10-CM | POA: Diagnosis present

## 2015-01-03 LAB — URINE MICROSCOPIC-ADD ON

## 2015-01-03 LAB — URINALYSIS, ROUTINE W REFLEX MICROSCOPIC
BILIRUBIN URINE: NEGATIVE
GLUCOSE, UA: NEGATIVE mg/dL
HGB URINE DIPSTICK: NEGATIVE
KETONES UR: NEGATIVE mg/dL
Nitrite: NEGATIVE
PH: 7 (ref 5.0–8.0)
PROTEIN: NEGATIVE mg/dL
Specific Gravity, Urine: 1.01 (ref 1.005–1.030)
Urobilinogen, UA: 0.2 mg/dL (ref 0.0–1.0)

## 2015-01-03 NOTE — Discharge Instructions (Signed)
You have constipation which is hard stools that are difficult to pass. It is important to have regular bowel movements every 1-3 days that are soft and easy to pass. Hard stools increase your risk of hemorrhoids and are very uncomfortable.   To prevent constipation you can increase the amount of fiber in your diet. Examples of foods with fiber are leafy greens, whole grain breads, oatmeal and other grains.  It is also important to drink at least eight 8oz glass of water everyday.   If you have not has a bowel movement in 4-5 days you made need to clean out your bowel.  This will have establish normal movement through your bowel.    Miralax Clean out  Take 8 capfuls of miralax in 64 oz of gatorade  Continue to drink at least eight 8 oz glasses of water throughout the day  You can repeat with another 8 capfuls of miralax in 64 oz of gatorade if you are not having a large amount of stools  You will need to be at home and close to a bathroom for about 8 hours when you do the above as you may need to go to the bathroom frequently.   After you are cleaned out: You can do any of these or all of theses treatments. The goal is to have soft "peanut butter" consistency poops.  - Start Colace100mg  twice daily - Start Miralax 1 capful once daily - Start a daily fiber supplement like metamucil or citrucel - if you are having diarrhea you can reduce to Colace once a day or miralax every other day or a 1/2 capful daily.   Safe Medications in Pregnancy   Acne: Benzoyl Peroxide Salicylic Acid  Backache/Headache: Tylenol: 2 regular strength every 4 hours OR              2 Extra strength every 6 hours  Colds/Coughs/Allergies: Benadryl (alcohol free) 25 mg every 6 hours as needed Breath right strips Claritin Cepacol throat lozenges Chloraseptic throat spray Cold-Eeze- up to three times per day Cough drops, alcohol free Flonase (by prescription only) Guaifenesin Mucinex Robitussin DM (plain  only, alcohol free) Saline nasal spray/drops Sudafed (pseudoephedrine) & Actifed ** use only after [redacted] weeks gestation and if you do not have high blood pressure Tylenol Vicks Vaporub Zinc lozenges Zyrtec   Constipation: Colace Ducolax suppositories Fleet enema Glycerin suppositories Metamucil Milk of magnesia Miralax Senokot Smooth move tea  Diarrhea: Kaopectate Imodium A-D  *NO pepto Bismol  Hemorrhoids: Anusol Anusol HC Preparation H Tucks  Indigestion: Tums Maalox Mylanta Zantac  Pepcid  Insomnia: Benadryl (alcohol free) 25mg  every 6 hours as needed Tylenol PM Unisom, no Gelcaps  Leg Cramps: Tums MagGel  Nausea/Vomiting:  Bonine Dramamine Emetrol Ginger extract Sea bands Meclizine  Nausea medication to take during pregnancy:  Unisom (doxylamine succinate 25 mg tablets) Take one tablet daily at bedtime. If symptoms are not adequately controlled, the dose can be increased to a maximum recommended dose of two tablets daily (1/2 tablet in the morning, 1/2 tablet mid-afternoon and one at bedtime). Vitamin B6 100mg  tablets. Take one tablet twice a day (up to 200 mg per day).  Skin Rashes: Aveeno products Benadryl cream or 25mg  every 6 hours as needed Calamine Lotion 1% cortisone cream  Yeast infection: Gyne-lotrimin 7 Monistat 7   **If taking multiple medications, please check labels to avoid duplicating the same active ingredients **take medication as directed on the label ** Do not exceed 4000 mg of tylenol in  24 hours **Do not take medications that contain aspirin or ibuprofen  Preterm Labor Information Preterm labor is when labor starts at less than 37 weeks of pregnancy. The normal length of a pregnancy is 39 to 41 weeks. CAUSES Often, there is no identifiable underlying cause as to why a woman goes into preterm labor. One of the most common known causes of preterm labor is infection. Infections of the uterus, cervix, vagina, amniotic  sac, bladder, kidney, or even the lungs (pneumonia) can cause labor to start. Other suspected causes of preterm labor include:   Urogenital infections, such as yeast infections and bacterial vaginosis.   Uterine abnormalities (uterine shape, uterine septum, fibroids, or bleeding from the placenta).   A cervix that has been operated on (it may fail to stay closed).   Malformations in the fetus.   Multiple gestations (twins, triplets, and so on).   Breakage of the amniotic sac.  RISK FACTORS  Having a previous history of preterm labor.   Having premature rupture of membranes (PROM).   Having a placenta that covers the opening of the cervix (placenta previa).   Having a placenta that separates from the uterus (placental abruption).   Having a cervix that is too weak to hold the fetus in the uterus (incompetent cervix).   Having too much fluid in the amniotic sac (polyhydramnios).   Taking illegal drugs or smoking while pregnant.   Not gaining enough weight while pregnant.   Being younger than 54 and older than 37 years old.   Having a low socioeconomic status.   Being African American. SYMPTOMS Signs and symptoms of preterm labor include:   Menstrual-like cramps, abdominal pain, or back pain.  Uterine contractions that are regular, as frequent as six in an hour, regardless of their intensity (may be mild or painful).  Contractions that start on the top of the uterus and spread down to the lower abdomen and back.   A sense of increased pelvic pressure.   A watery or bloody mucus discharge that comes from the vagina.  TREATMENT Depending on the length of the pregnancy and other circumstances, your health care provider may suggest bed rest. If necessary, there are medicines that can be given to stop contractions and to mature the fetal lungs. If labor happens before 34 weeks of pregnancy, a prolonged hospital stay may be recommended. Treatment depends on  the condition of both you and the fetus.  WHAT SHOULD YOU DO IF YOU THINK YOU ARE IN PRETERM LABOR? Call your health care provider right away. You will need to go to the hospital to get checked immediately. HOW CAN YOU PREVENT PRETERM LABOR IN FUTURE PREGNANCIES? You should:   Stop smoking if you smoke.  Maintain healthy weight gain and avoid chemicals and drugs that are not necessary.  Be watchful for any type of infection.  Inform your health care provider if you have a known history of preterm labor.   This information is not intended to replace advice given to you by your health care provider. Make sure you discuss any questions you have with your health care provider.   Document Released: 05/13/2003 Document Revised: 10/23/2012 Document Reviewed: 03/25/2012 Elsevier Interactive Patient Education [employer].

## 2015-01-03 NOTE — MAU Provider Note (Signed)
History    CSN: [id number] Arrival date and time: 01/03/15 0220   Chief Complaint  Patient presents with  . Vaginal Bleeding   HPI Patient is 39 y.o. G1P0 [redacted]w[redacted]d with pregnancy complicated by Di/Di twins, short cervix (CL~ 0.3cm) here with complaints of pink discharge.  Patient was admitted on 10/27 after anatomy scan showed a very shortened cervical length. The patient had an TVUS on 10/27 and 10/29.  Denies sexual activity. Denies urinary sx. Denies contractions.   +FM, denies LOF,  contractions, vaginal discharge.   OB History    Gravida Para Term Preterm AB TAB SAB Ectopic Multiple Living   1         0      Past Medical History  Diagnosis Date  . Heart murmur   . Fibroids   . Hypertension     Past Surgical History  Procedure Laterality Date  . No past surgeries      Family History  Problem Relation Age of Onset  . Diabetes Mother   . Cancer Mother   . Hypertension Mother     Social History  Substance Use Topics  . Smoking status: Former Smoker    Quit date: 12/31/2010  . Smokeless tobacco: None  . Alcohol Use: No    Allergies:  Allergies  Allergen Reactions  . Aspirin Hives  . Codeine Hives and Itching  . Percocet [Oxycodone-Acetaminophen] Itching    Prescriptions prior to admission  Medication Sig Dispense Refill Last Dose  . docusate sodium (COLACE) 100 MG capsule Take 1 capsule (100 mg total) by mouth daily. 10 capsule 0 01/02/2015 at 2100  . NIFEdipine (PROCARDIA) 10 MG capsule Take 1 capsule (10 mg total) by mouth every 6 (six) hours as needed. 30 capsule 1 01/03/2015 at 0000  . progesterone (PROMETRIUM) 200 MG capsule Place 1 capsule (200 mg total) vaginally at bedtime. 30 capsule 4 01/03/2015 at 0000  . acetaminophen (TYLENOL) 500 MG tablet Take 1,000 mg by mouth every 6 (six) hours as needed for mild pain or headache.   12/30/2014 at Unknown time  . calcium carbonate (TUMS - DOSED IN MG ELEMENTAL CALCIUM) 500 MG chewable tablet Chew 2 tablets  by mouth 4 (four) times daily as needed for indigestion or heartburn.   12/30/2014 at Unknown time  . Prenatal Vit-Fe Fumarate-FA (PRENATAL VITAMIN PO) Take by mouth.   12/31/2014 at Unknown time  . promethazine (PHENERGAN) 12.5 MG tablet Take 1 tablet by mouth every 6 (six) hours as needed.   Past Week at Unknown time    Review of Systems  Constitutional: Negative for fever and chills.  Eyes: Negative for blurred vision and double vision.  Respiratory: Negative for cough and shortness of breath.   Cardiovascular: Negative for chest pain and orthopnea.  Gastrointestinal: Negative for nausea and vomiting.  Genitourinary: Negative for dysuria, frequency and flank pain.  Musculoskeletal: Negative for myalgias.  Skin: Negative for rash.  Neurological: Negative for dizziness, tingling, weakness and headaches.  Endo/Heme/Allergies: Does not bruise/bleed easily.  Psychiatric/Behavioral: Negative for depression and suicidal ideas. The patient is not nervous/anxious.    Physical Exam   Blood pressure 130/73, pulse 90, temperature 98.8 F (37.1 C), temperature source Oral, resp. rate 16, last menstrual period 07/19/2014, SpO2 98 %.  Physical Exam  Nursing note and vitals reviewed. Constitutional: She is oriented to person, place, and time. She appears well-developed and well-nourished. No distress.  Pregnant female  HENT:  Head: Normocephalic and atraumatic.  Eyes: Conjunctivae are normal.  No scleral icterus.  Neck: Normal range of motion. Neck supple.  Cardiovascular: Normal rate and intact distal pulses.   Respiratory: Effort normal. She exhibits no tenderness.  GI: Soft. There is no tenderness. There is no rebound and no guarding.  Gravid, ~3FH above umbilicus  Genitourinary: Vagina normal.  Pink tinged vaginal discharge. No blood from cervix. Visually closed. Manual exam not performed.   Musculoskeletal: Normal range of motion. She exhibits no edema.  Neurological: She is alert and  oriented to person, place, and time.  Skin: Skin is warm and dry. No rash noted.  Psychiatric: She has a normal mood and affect.    MAU Course  Procedures  MDM No labs needed  NST Twin A 145/mod/+accels (10x10) Twin B 140/mod/+accels (10x10) Toco: quiet  Assessment and Plan  Miroslava Carthins is a 39 y.o. G1P0 at [redacted]w[redacted]d presenting with pink discharge in the setting of known shortened cervix.  ddx would include cervical trauma from two invasive exams vs PTL. No other sx of PTL   #Pink Discharge: likely related to multiple exams in the last 48 hours. No contractions. Feel it is safe to return home with strict precautions to return. I reviewed preterm labor signs and symptoms in detail and patient&partner voiced understanding.  Patient has short interval follow up with OB office on Monday at 10:40. Reviewed importance of bedrest and pelvic rest.   #FWB: cat I, reactive. S/p BMZ now > 48 hours of lung maturity.   Juanita Craver Detron Carras 01/03/2015, 5:24 AM

## 2015-01-03 NOTE — MAU Note (Signed)
Pt was discharged from antenatal unit yesterday and she inserted the pergesterone supp at 0000 abd at 0200 she noticed some bleeding. Also noticed some "discomfort on her left side.

## 2015-01-03 NOTE — Discharge Summary (Addendum)
Physician Discharge Summary  Patient ID: Cindy Erickson MRN: [id number] DOB/AGE: [birth year] 39 y.o.  Admit date: 12/31/2014 Discharge date:01/02/2015  Admission Diagnoses:  Twins, shortness of breath  Discharge Diagnoses:  Active Problems:   Pregnancy with history of pre-term labor   Short cervix   Discharged Condition: stable - transferred to [facility] Course: Pt was admitted from the office with SOB.  Further evaluation revealed pulmonary edema.  Cardiology consulted and echo performed showing mild cardiomyopathy.  Pt symptoms significantly improved with diuresis and adjustment of her beta blocker.  Infant stable on Ultrasound.  MFM rec delivery @ 34 weeks after steroids with close telemetry monitoring before and after delivery.  Decision made for pt to be transferred to [facility] to allow for close cardiac monitoring.  Pt stable at transfer.  Consults: cardiology and MFM  Significant Diagnostic Studies: cardiac graphics: Echocardiogram: per cardio  Treatments: steroids: BMZ,   Discharge Exam: Blood pressure 122/73, pulse 87, temperature 98.2 F (36.8 C), temperature source Oral, resp. rate 18, height 5\' 7"  (1.702 m), weight 174 lb (78.926 kg), last menstrual period 07/19/2014, SpO2 99 %. General appearance: alert and cooperative Resp: rhonchi improved bilaterally Cardio: regular rate and rhythm GI: normal findings: gravid, NT  Disposition:  tr[facility]fer [facility]    Medication List    STOP taking these medications        HYDROcodone-acetaminophen 5-325 MG tablet  Commonly known as:  NORCO/VICODIN      TAKE these medications        acetaminophen 500 MG tablet  Commonly known as:  TYLENOL  Take 1,000 mg by mouth every 6 (six) hours as needed for mild pain or headache.     calcium carbonate 500 MG chewable tablet  Commonly known as:  TUMS - dosed in mg elemental calcium  Chew 2 tablets by mouth 4 (four) times daily as needed for indigestion or  heartburn.     docusate sodium 100 MG capsule  Commonly known as:  COLACE  Take 1 capsule (100 mg total) by mouth daily.     NIFEdipine 10 MG capsule  Commonly known as:  PROCARDIA  Take 1 capsule (10 mg total) by mouth every 6 (six) hours as needed.     PRENATAL VITAMIN PO  Take by mouth.     progesterone 200 MG capsule  Commonly known as:  PROMETRIUM  Place 1 capsule (200 mg total) vaginally at bedtime.     promethazine 12.5 MG tablet  Commonly known as:  PHENERGAN  Take 1 tablet by mouth every 6 (six) hours as needed.           Follow-up Information    Schedule an appointment as soon as possible for a visit in 3 days to follow up.      Signed: Jaleen Grupp 01/03/2015, 8:23 AM

## 2015-01-21 ENCOUNTER — Encounter (HOSPITAL_COMMUNITY): Payer: Self-pay

## 2015-01-22 ENCOUNTER — Other Ambulatory Visit (HOSPITAL_COMMUNITY): Payer: Self-pay | Admitting: Obstetrics & Gynecology

## 2015-01-22 DIAGNOSIS — O09522 Supervision of elderly multigravida, second trimester: Secondary | ICD-10-CM

## 2015-01-22 DIAGNOSIS — Z3A27 27 weeks gestation of pregnancy: Secondary | ICD-10-CM

## 2015-01-22 DIAGNOSIS — O26879 Cervical shortening, unspecified trimester: Secondary | ICD-10-CM

## 2015-01-22 DIAGNOSIS — IMO0001 Reserved for inherently not codable concepts without codable children: Secondary | ICD-10-CM

## 2015-01-25 ENCOUNTER — Encounter (HOSPITAL_COMMUNITY): Payer: Self-pay

## 2015-01-25 ENCOUNTER — Ambulatory Visit (HOSPITAL_COMMUNITY)
Admission: RE | Admit: 2015-01-25 | Discharge: 2015-01-25 | Disposition: A | Discharge status: Home / Self Care | Payer: No Typology Code available for payment source | Source: Ambulatory Visit | Attending: Obstetrics & Gynecology | Admitting: Obstetrics & Gynecology

## 2015-01-25 ENCOUNTER — Other Ambulatory Visit (HOSPITAL_COMMUNITY): Payer: Self-pay | Admitting: Obstetrics & Gynecology

## 2015-01-25 DIAGNOSIS — O10012 Pre-existing essential hypertension complicating pregnancy, second trimester: Secondary | ICD-10-CM | POA: Insufficient documentation

## 2015-01-25 DIAGNOSIS — D259 Leiomyoma of uterus, unspecified: Secondary | ICD-10-CM

## 2015-01-25 DIAGNOSIS — O30042 Twin pregnancy, dichorionic/diamniotic, second trimester: Secondary | ICD-10-CM

## 2015-01-25 DIAGNOSIS — O26872 Cervical shortening, second trimester: Secondary | ICD-10-CM

## 2015-01-25 DIAGNOSIS — O3412 Maternal care for benign tumor of corpus uteri, second trimester: Secondary | ICD-10-CM

## 2015-01-25 DIAGNOSIS — O09512 Supervision of elderly primigravida, second trimester: Secondary | ICD-10-CM | POA: Diagnosis not present

## 2015-01-25 DIAGNOSIS — O281 Abnormal biochemical finding on antenatal screening of mother: Secondary | ICD-10-CM

## 2015-01-25 DIAGNOSIS — Z3A27 27 weeks gestation of pregnancy: Secondary | ICD-10-CM

## 2015-01-25 DIAGNOSIS — O10912 Unspecified pre-existing hypertension complicating pregnancy, second trimester: Secondary | ICD-10-CM

## 2015-01-25 DIAGNOSIS — IMO0001 Reserved for inherently not codable concepts without codable children: Secondary | ICD-10-CM

## 2015-01-25 DIAGNOSIS — O283 Abnormal ultrasonic finding on antenatal screening of mother: Secondary | ICD-10-CM | POA: Diagnosis not present

## 2015-01-25 DIAGNOSIS — O34212 Maternal care for vertical scar from previous cesarean delivery: Secondary | ICD-10-CM | POA: Insufficient documentation

## 2015-01-25 DIAGNOSIS — O09522 Supervision of elderly multigravida, second trimester: Secondary | ICD-10-CM

## 2015-01-25 DIAGNOSIS — O26879 Cervical shortening, unspecified trimester: Secondary | ICD-10-CM

## 2015-02-05 ENCOUNTER — Other Ambulatory Visit (HOSPITAL_COMMUNITY): Payer: Self-pay

## 2015-02-05 ENCOUNTER — Encounter (HOSPITAL_COMMUNITY): Payer: Self-pay

## 2015-03-04 ENCOUNTER — Inpatient Hospital Stay (HOSPITAL_COMMUNITY)
Admission: AD | Admit: 2015-03-04 | Discharge: 2015-03-04 | Disposition: A | Discharge status: Home / Self Care | Payer: No Typology Code available for payment source | Source: Ambulatory Visit | Attending: Obstetrics and Gynecology | Admitting: Obstetrics and Gynecology

## 2015-03-04 DIAGNOSIS — Z885 Allergy status to narcotic agent status: Secondary | ICD-10-CM | POA: Diagnosis not present

## 2015-03-04 DIAGNOSIS — I10 Essential (primary) hypertension: Secondary | ICD-10-CM | POA: Insufficient documentation

## 2015-03-04 DIAGNOSIS — O9989 Other specified diseases and conditions complicating pregnancy, childbirth and the puerperium: Secondary | ICD-10-CM | POA: Insufficient documentation

## 2015-03-04 DIAGNOSIS — N859 Noninflammatory disorder of uterus, unspecified: Secondary | ICD-10-CM

## 2015-03-04 DIAGNOSIS — Z87891 Personal history of nicotine dependence: Secondary | ICD-10-CM | POA: Insufficient documentation

## 2015-03-04 DIAGNOSIS — Z3A32 32 weeks gestation of pregnancy: Secondary | ICD-10-CM | POA: Insufficient documentation

## 2015-03-04 DIAGNOSIS — N858 Other specified noninflammatory disorders of uterus: Secondary | ICD-10-CM

## 2015-03-04 DIAGNOSIS — Z886 Allergy status to analgesic agent status: Secondary | ICD-10-CM | POA: Diagnosis not present

## 2015-03-04 DIAGNOSIS — O26893 Other specified pregnancy related conditions, third trimester: Secondary | ICD-10-CM | POA: Insufficient documentation

## 2015-03-04 LAB — URINALYSIS, ROUTINE W REFLEX MICROSCOPIC
Bilirubin Urine: NEGATIVE
GLUCOSE, UA: NEGATIVE mg/dL
Hgb urine dipstick: NEGATIVE
Ketones, ur: NEGATIVE mg/dL
Nitrite: NEGATIVE
PH: 6.5 (ref 5.0–8.0)
Protein, ur: NEGATIVE mg/dL
SPECIFIC GRAVITY, URINE: 1.01 (ref 1.005–1.030)

## 2015-03-04 LAB — URINE MICROSCOPIC-ADD ON: BACTERIA UA: NONE SEEN

## 2015-03-04 NOTE — MAU Note (Signed)
Pt sent from MD office, twin gestation, has short cervix.  Pt C/O some pelvic pressure, possibly braxton hicks - occasional tightening.  Denies bleeding or LOF.

## 2015-03-04 NOTE — MAU Provider Note (Signed)
Chief Complaint:  pelvic pressure    First Provider Initiated Contact with Patient 03/04/15 1459     HPI   Cindy Erickson is a 39 y.o. G1P0 at 49w4dwho presents to maternity admissions reporting PELVIC PRESSURE.  Has recently been found to have a short cervix, so was sent here to monitor for PTL.Marland Kitchen  Took Procardia 3 hours ago She reports good fetal movement, denies LOF, vaginal bleeding, vaginal itching/burning, urinary symptoms, h/a, dizziness, n/v, or fever/chills.  She denies headache, visual changes or abdominal pain.  RN Note: Pt sent from MD office, twin gestation, has short cervix. Pt C/O some pelvic pressure, possibly braxton hicks - occasional tightening. Denies bleeding or LOF.        Past Medical History: Past Medical History  Diagnosis Date  . Heart murmur   . Fibroids   . Hypertension     Past obstetric history: OB History  Gravida Para Term Preterm AB SAB TAB Ectopic Multiple Living  1         0    # Outcome Date GA Lbr Len/2nd Weight Sex Delivery Anes PTL Lv  1 Current               Past Surgical History: Past Surgical History  Procedure Laterality Date  . No past surgeries      Family History: Family History  Problem Relation Age of Onset  . Diabetes Mother   . Cancer Mother   . Hypertension Mother     Social History: Social History  Substance Use Topics  . Smoking status: Former Smoker    Quit date: 12/31/2010  . Smokeless tobacco: Not on file  . Alcohol Use: No    Allergies:  Allergies  Allergen Reactions  . Aspirin Hives  . Codeine Hives and Itching  . Percocet [Oxycodone-Acetaminophen] Itching    Meds:  No prescriptions prior to admission    ROS:  Review of Systems  Constitutional: Negative for fever, chills, appetite change and fatigue.  Respiratory: Negative for shortness of breath.   Genitourinary: Negative for dysuria, flank pain, vaginal bleeding, vaginal discharge and pelvic pain.  Musculoskeletal: Negative for  back pain.   Other systems negative  I have reviewed patient's Past Medical Hx, Surgical Hx, Family Hx, Social Hx, medications and allergies.   Physical Exam  Patient Vitals for the past 24 hrs:  BP Temp Temp src Pulse Resp  03/04/15 1548 134/80 mmHg - - 96 18  03/04/15 1423 130/81 mmHg 98 F (36.7 C) Oral 95 18   Constitutional: Well-developed, well-nourished female in no acute distress.  Cardiovascular: normal rate Respiratory: normal effort GI: Abd soft, non-tender, gravid appropriate for gestational age.  MS: Extremities nontender, no edema, normal ROM Neurologic: Alert and oriented x 4.  GU: Neg CVAT.  PELVIC EXAM: Cervix checked in office     FHT:  Baseline 140 , moderate variability, accelerations present, no decelerations Contractions: irritability, cramps last only 10 seconds    Labs: Results for orders placed or performed during the hospital encounter of 03/04/15 (from the past 24 hour(s))  Urinalysis, Routine w reflex microscopic (not at [facility])     Status: Abnormal   Collection Time: 03/04/15  2:00 PM  Result Value Ref Range   Color, Urine YELLOW YELLOW   APPearance CLEAR CLEAR   Specific Gravity, Urine 1.010 1.005 - 1.030   pH 6.5 5.0 - 8.0   Glucose, UA NEGATIVE NEGATIVE mg/dL   Hgb urine dipstick NEGATIVE NEGATIVE   Bilirubin Urine NEGATIVE  NEGATIVE   Ketones, ur NEGATIVE NEGATIVE mg/dL   Protein, ur NEGATIVE NEGATIVE mg/dL   Nitrite NEGATIVE NEGATIVE   Leukocytes, UA SMALL (A) NEGATIVE  Urine microscopic-add on     Status: Abnormal   Collection Time: 03/04/15  2:00 PM  Result Value Ref Range   Squamous Epithelial / LPF 0-5 (A) NONE SEEN   WBC, UA 0-5 0 - 5 WBC/hpf   RBC / HPF 0-5 0 - 5 RBC/hpf   Bacteria, UA NONE SEEN NONE SEEN   --/--/O POS, O POS (10/27 1819)  Imaging:  No results found.  MAU Course/MDM:  NST reviewed Consult Dr Radene Knee with presentation, exam findings He recommends discharge home with followup in office.   Pt stable at  time of discharge.   Assessment: 1. Uterine irritability     Plan: Discharge home Preterm Labor precautions and fetal kick counts Follow up in Office for prenatal visits and recheck Follow-up Information    Schedule an appointment as soon as possible for a visit with Darlyn Chamber, MD.   Specialty:  Obstetrics and Gynecology   Contact information:   [address] [place] [address] [phone]        Medication List    STOP taking these medications        docusate sodium 100 MG capsule  Commonly known as:  COLACE      TAKE these medications        acetaminophen 500 MG tablet  Commonly known as:  TYLENOL  Take 1,000 mg by mouth every 6 (six) hours as needed for mild pain or headache.     calcium carbonate 500 MG chewable tablet  Commonly known as:  TUMS - dosed in mg elemental calcium  Chew 2 tablets by mouth 4 (four) times daily as needed for indigestion or heartburn.     ferrous sulfate 325 (65 FE) MG tablet  Take 325 mg by mouth daily with breakfast.     NIFEdipine 10 MG capsule  Commonly known as:  PROCARDIA  Take 1 capsule (10 mg total) by mouth every 6 (six) hours as needed.     polyethylene glycol packet  Commonly known as:  MIRALAX / GLYCOLAX  Take 17 g by mouth as needed for mild constipation.     PRENATAL VITAMIN PO  Take by mouth.     progesterone 200 MG capsule  Commonly known as:  PROMETRIUM  Place 1 capsule (200 mg total) vaginally at bedtime.     promethazine 12.5 MG tablet  Commonly known as:  PHENERGAN  Take 1 tablet by mouth every 6 (six) hours as needed.        Hansel Feinstein CNM, MSN Certified Nurse-Midwife 03/04/2015 4:13 PM

## 2015-03-04 NOTE — Discharge Instructions (Signed)

## 2015-03-15 ENCOUNTER — Inpatient Hospital Stay (HOSPITAL_COMMUNITY)
Admission: AD | Admit: 2015-03-15 | Discharge: 2015-03-15 | Disposition: A | Discharge status: Home / Self Care | Payer: Medicaid Other | Source: Ambulatory Visit | Attending: Obstetrics and Gynecology | Admitting: Obstetrics and Gynecology

## 2015-03-15 ENCOUNTER — Encounter (HOSPITAL_COMMUNITY): Payer: Self-pay | Admitting: *Deleted

## 2015-03-15 DIAGNOSIS — O10913 Unspecified pre-existing hypertension complicating pregnancy, third trimester: Secondary | ICD-10-CM | POA: Diagnosis not present

## 2015-03-15 DIAGNOSIS — Z3A34 34 weeks gestation of pregnancy: Secondary | ICD-10-CM | POA: Insufficient documentation

## 2015-03-15 DIAGNOSIS — O4703 False labor before 37 completed weeks of gestation, third trimester: Secondary | ICD-10-CM | POA: Diagnosis not present

## 2015-03-15 DIAGNOSIS — I10 Essential (primary) hypertension: Secondary | ICD-10-CM | POA: Diagnosis present

## 2015-03-15 DIAGNOSIS — Z87891 Personal history of nicotine dependence: Secondary | ICD-10-CM | POA: Insufficient documentation

## 2015-03-15 LAB — LACTATE DEHYDROGENASE: LDH: 157 U/L (ref 98–192)

## 2015-03-15 LAB — URINALYSIS, ROUTINE W REFLEX MICROSCOPIC
BILIRUBIN URINE: NEGATIVE
Glucose, UA: NEGATIVE mg/dL
HGB URINE DIPSTICK: NEGATIVE
Ketones, ur: NEGATIVE mg/dL
Nitrite: NEGATIVE
PH: 7 (ref 5.0–8.0)
Protein, ur: NEGATIVE mg/dL
SPECIFIC GRAVITY, URINE: 1.01 (ref 1.005–1.030)

## 2015-03-15 LAB — COMPREHENSIVE METABOLIC PANEL
ALK PHOS: 101 U/L (ref 38–126)
ALT: 11 U/L — AB (ref 14–54)
AST: 16 U/L (ref 15–41)
Albumin: 2.7 g/dL — ABNORMAL LOW (ref 3.5–5.0)
Anion gap: 7 (ref 5–15)
BUN: 6 mg/dL (ref 6–20)
CALCIUM: 9.2 mg/dL (ref 8.9–10.3)
CHLORIDE: 108 mmol/L (ref 101–111)
CO2: 23 mmol/L (ref 22–32)
CREATININE: 0.58 mg/dL (ref 0.44–1.00)
GFR calc Af Amer: >60 mL/min (ref >60)
Glucose, Bld: 98 mg/dL (ref 65–99)
Potassium: 3.4 mmol/L — ABNORMAL LOW (ref 3.5–5.1)
SODIUM: 138 mmol/L (ref 135–145)
Total Bilirubin: 0.4 mg/dL (ref 0.3–1.2)
Total Protein: 6.4 g/dL — ABNORMAL LOW (ref 6.5–8.1)

## 2015-03-15 LAB — URIC ACID: URIC ACID, SERUM: 3.9 mg/dL (ref 2.3–6.6)

## 2015-03-15 LAB — URINE MICROSCOPIC-ADD ON

## 2015-03-15 LAB — PROTEIN / CREATININE RATIO, URINE
Creatinine, Urine: 114 mg/dL
Protein Creatinine Ratio: 0.12 mg/mg{Cre} (ref 0.00–0.15)
Total Protein, Urine: 14 mg/dL

## 2015-03-15 LAB — CBC
HCT: 31.1 % — ABNORMAL LOW (ref 36.0–46.0)
HEMOGLOBIN: 10.6 g/dL — AB (ref 12.0–15.0)
MCH: 31 pg (ref 26.0–34.0)
MCHC: 34.1 g/dL (ref 30.0–36.0)
MCV: 90.9 fL (ref 78.0–100.0)
Platelets: 208 10*3/uL (ref 150–400)
RBC: 3.42 MIL/uL — AB (ref 3.87–5.11)
RDW: 15 % (ref 11.5–15.5)
WBC: 10.8 10*3/uL — AB (ref 4.0–10.5)

## 2015-03-15 MED ORDER — ACETAMINOPHEN 325 MG PO TABS
650.0000 mg | ORAL_TABLET | Freq: Once | ORAL | Status: AC
  Administered 2015-03-15: 650 mg via ORAL
  Filled 2015-03-15: qty 2

## 2015-03-15 NOTE — MAU Note (Signed)
Patient states she was seen by Dr. Helane Rima and was sent to MAU for further evaluation of BP and contractions. States she was dilated 1cm in office.

## 2015-03-15 NOTE — MAU Provider Note (Signed)
History     CSN: [id number]  Arrival date and time: 03/15/15 1216   First Provider Initiated Contact with Patient 03/15/15 1300       Chief Complaint  Patient presents with  . Hypertension  . Contractions   HPI Cindy Erickson is a 40 y.o. G1P0 at [redacted]w[redacted]d who presents sent from the office for [facility] evaluation & contractions.  Has history of chronic hypertension, not on meds during pregnancy. Has had some elevated BPs during the last few prenatal visits. In office today BP was 140/80.  Currently has headache. Right frontal that she rates 6/10. No treatment. Is like headaches she's had in past, reports increase in frequency of headaches in the last few weeks.  Denies vision changes, chest pain, SOB, or epigastric pain.   Reports contractions since this weekend. Feels 1-2 contractions per hour and rates them as 3/10 on pain scale. Was checked in office today & dilated 1cm.  Some bloody discharge every day since using prometrium. Denies LOF. Positive fetal movement.    OB History    Gravida Para Term Preterm AB TAB SAB Ectopic Multiple Living   1         0      Past Medical History  Diagnosis Date  . Heart murmur   . Fibroids   . Hypertension     Past Surgical History  Procedure Laterality Date  . No past surgeries      Family History  Problem Relation Age of Onset  . Diabetes Mother   . Cancer Mother   . Hypertension Mother     Social History  Substance Use Topics  . Smoking status: Former Smoker    Quit date: 12/31/2010  . Smokeless tobacco: None  . Alcohol Use: No    Allergies:  Allergies  Allergen Reactions  . Aspirin Hives  . Codeine Hives and Itching  . Percocet [Oxycodone-Acetaminophen] Itching    Prescriptions prior to admission  Medication Sig Dispense Refill Last Dose  . acetaminophen (TYLENOL) 500 MG tablet Take 1,000 mg by mouth every 6 (six) hours as needed for mild pain or headache.   03/03/2015 at Unknown time  . calcium carbonate (TUMS -  DOSED IN MG ELEMENTAL CALCIUM) 500 MG chewable tablet Chew 2 tablets by mouth 4 (four) times daily as needed for indigestion or heartburn.   Past Week at Unknown time  . ferrous sulfate 325 (65 FE) MG tablet Take 325 mg by mouth daily with breakfast.   03/03/2015 at Unknown time  . NIFEdipine (PROCARDIA) 10 MG capsule Take 1 capsule (10 mg total) by mouth every 6 (six) hours as needed. 30 capsule 1 03/04/2015 at Unknown time  . polyethylene glycol (MIRALAX / GLYCOLAX) packet Take 17 g by mouth as needed for mild constipation.   Past Month at Unknown time  . Prenatal Vit-Fe Fumarate-FA (PRENATAL VITAMIN PO) Take by mouth.   03/04/2015 at Unknown time  . progesterone (PROMETRIUM) 200 MG capsule Place 1 capsule (200 mg total) vaginally at bedtime. 30 capsule 4 03/03/2015 at Unknown time  . promethazine (PHENERGAN) 12.5 MG tablet Take 1 tablet by mouth every 6 (six) hours as needed.   Past Week at Unknown time    Review of Systems  Eyes: Negative for blurred vision and double vision.  Respiratory: Negative.   Cardiovascular: Negative.   Gastrointestinal: Negative.   Genitourinary: Negative.   Neurological: Positive for headaches. Negative for dizziness.   Physical Exam   Blood pressure 140/83, pulse 97, temperature  98.1 F (36.7 C), temperature source Oral, resp. rate 18, last menstrual period 07/19/2014.  Patient Vitals for the past 24 hrs:  BP Temp Temp src Pulse Resp  03/15/15 1317 129/75 mmHg - - 89 -  03/15/15 1302 138/82 mmHg - - 96 -  03/15/15 1247 140/83 mmHg - - 97 -  03/15/15 1244 139/89 mmHg - - 106 18  03/15/15 1243 - 98.1 F (36.7 C) Oral - -     Physical Exam  Nursing note and vitals reviewed. Constitutional: She is oriented to person, place, and time. She appears well-developed and well-nourished. No distress.  HENT:  Head: Normocephalic and atraumatic.  Eyes: Conjunctivae are normal. Right eye exhibits no discharge. Left eye exhibits no discharge. No scleral icterus.   Neck: Normal range of motion.  Cardiovascular: Normal rate, regular rhythm and normal heart sounds.   No murmur heard. Respiratory: Effort normal and breath sounds normal. No respiratory distress. She has no wheezes.  GI: Soft.  Musculoskeletal: Normal range of motion. She exhibits no edema.  Neurological: She is alert and oriented to person, place, and time. She has normal reflexes.  No clonus  Skin: Skin is warm and dry. She is not diaphoretic.  Psychiatric: She has a normal mood and affect. Her behavior is normal. Judgment and thought content normal.   Fetal Tracing: BABY A Baseline: 140 Variability: moderate Accelerations: 15x15 Decelerations: none  BABY B Baseline: 135 Variability: moderate Accelerations: 15x15 Decelerations: none  Toco: UI, 20-50 secs  Dilation: 1 Effacement (%): 80 Cervical Position: Middle Station: Ballotable Exam by:: Jorje Guild NP  MAU Course  Procedures Results for orders placed or performed during the hospital encounter of 03/15/15 (from the past 24 hour(s))  Urinalysis, Routine w reflex microscopic (not at [facility])     Status: Abnormal   Collection Time: 03/15/15 12:30 PM  Result Value Ref Range   Color, Urine YELLOW YELLOW   APPearance HAZY (A) CLEAR   Specific Gravity, Urine 1.010 1.005 - 1.030   pH 7.0 5.0 - 8.0   Glucose, UA NEGATIVE NEGATIVE mg/dL   Hgb urine dipstick NEGATIVE NEGATIVE   Bilirubin Urine NEGATIVE NEGATIVE   Ketones, ur NEGATIVE NEGATIVE mg/dL   Protein, ur NEGATIVE NEGATIVE mg/dL   Nitrite NEGATIVE NEGATIVE   Leukocytes, UA SMALL (A) NEGATIVE  Protein / creatinine ratio, urine     Status: None   Collection Time: 03/15/15 12:30 PM  Result Value Ref Range   Creatinine, Urine 114.00 mg/dL   Total Protein, Urine 14 mg/dL   Protein Creatinine Ratio 0.12 0.00 - 0.15 mg/mg[Cre]  Urine microscopic-add on     Status: Abnormal   Collection Time: 03/15/15 12:30 PM  Result Value Ref Range   Squamous Epithelial / LPF  0-5 (A) NONE SEEN   WBC, UA 0-5 0 - 5 WBC/hpf   RBC / HPF 0-5 0 - 5 RBC/hpf   Bacteria, UA FEW (A) NONE SEEN  CBC     Status: Abnormal   Collection Time: 03/15/15 12:44 PM  Result Value Ref Range   WBC 10.8 (H) 4.0 - 10.5 K/uL   RBC 3.42 (L) 3.87 - 5.11 MIL/uL   Hemoglobin 10.6 (L) 12.0 - 15.0 g/dL   HCT 31.1 (L) 36.0 - 46.0 %   MCV 90.9 78.0 - 100.0 fL   MCH 31.0 26.0 - 34.0 pg   MCHC 34.1 30.0 - 36.0 g/dL   RDW 15.0 11.5 - 15.5 %   Platelets 208 150 - 400 K/uL  Comprehensive  metabolic panel     Status: Abnormal   Collection Time: 03/15/15 12:44 PM  Result Value Ref Range   Sodium 138 135 - 145 mmol/L   Potassium 3.4 (L) 3.5 - 5.1 mmol/L   Chloride 108 101 - 111 mmol/L   CO2 23 22 - 32 mmol/L   Glucose, Bld 98 65 - 99 mg/dL   BUN 6 6 - 20 mg/dL   Creatinine, Ser 0.58 0.44 - 1.00 mg/dL   Calcium 9.2 8.9 - 10.3 mg/dL   Total Protein 6.4 (L) 6.5 - 8.1 g/dL   Albumin 2.7 (L) 3.5 - 5.0 g/dL   AST 16 15 - 41 U/L   ALT 11 (L) 14 - 54 U/L   Alkaline Phosphatase 101 38 - 126 U/L   Total Bilirubin 0.4 0.3 - 1.2 mg/dL   GFR calc non Af Amer >60 >60 mL/min   GFR calc Af Amer >60 >60 mL/min   Anion gap 7 5 - 15  Lactate dehydrogenase     Status: None   Collection Time: 03/15/15 12:44 PM  Result Value Ref Range   LDH 157 98 - 192 U/L  Uric acid     Status: None   Collection Time: 03/15/15 12:44 PM  Result Value Ref Range   Uric Acid, Serum 3.9 2.3 - 6.6 mg/dL    MDM Tylenol po for headache - pt reports improvement Category 1 tracing No severe range BPs Cervix unchanged from office visit 1348- S/w Dr. Julien Girt. Discussed labs, BPs, SVE, & FHT. Ok to discharge home. Write out of work. Return to office on Thursday for BP check.  Assessment and Plan  A:  1. Chronic hypertension in pregnancy, third trimester   2. Preterm contractions, third trimester    P: Discharge home Pre E & PTL precautions given Patient on bedrest since October & is currently not working BP check in  office on thursday  Jorje Guild, NP  03/15/2015, 12:59 PM

## 2015-03-15 NOTE — Discharge Instructions (Signed)
Hypertension During Pregnancy °Hypertension, or high blood pressure, is when there is extra pressure inside your blood vessels that carry blood from the heart to the rest of your body (arteries). It can happen at any time in life, including pregnancy. Hypertension during pregnancy can cause problems for you and your baby. Your baby might not weigh as much as he or she should at birth or might be born early (premature). Very bad cases of hypertension during pregnancy can be life-threatening.  °Different types of hypertension can occur during pregnancy. These include: °· Chronic hypertension. This happens when a woman has hypertension before pregnancy and it continues during pregnancy. °· Gestational hypertension. This is when hypertension develops during pregnancy. °· Preeclampsia or toxemia of pregnancy. This is a very serious type of hypertension that develops only during pregnancy. It affects the whole body and can be very dangerous for both mother and baby.   °Gestational hypertension and preeclampsia usually go away after your baby is born. Your blood pressure will likely stabilize within 6 weeks. Women who have hypertension during pregnancy have a greater chance of developing hypertension later in life or with future pregnancies. °RISK FACTORS °There are certain factors that make it more likely for you to develop hypertension during pregnancy. These include: °1. Having hypertension before pregnancy. °2. Having hypertension during a previous pregnancy. °3. Being overweight. °4. Being older than 40 years. °5. Being pregnant with more than one baby. °6. Having diabetes or kidney problems. °SIGNS AND SYMPTOMS °Chronic and gestational hypertension rarely cause symptoms. Preeclampsia has symptoms, which may include: °· Increased protein in your urine. Your health care provider will check for this at every prenatal visit. °· Swelling of your hands and face. °· Rapid weight gain. °· Headaches. °· Visual  changes. °· Being bothered by light. °· Abdominal pain, especially in the upper right area. °· Chest pain. °· Shortness of breath. °· Increased reflexes. °· Seizures. These occur with a more severe form of preeclampsia, called eclampsia. °DIAGNOSIS  °You may be diagnosed with hypertension during a regular prenatal exam. At each prenatal visit, you may have: °· Your blood pressure checked. °· A urine test to check for protein in your urine. °The type of hypertension you are diagnosed with depends on when you developed it. It also depends on your specific blood pressure reading. °· Developing hypertension before 20 weeks of pregnancy is consistent with chronic hypertension. °· Developing hypertension after 20 weeks of pregnancy is consistent with gestational hypertension. °· Hypertension with increased urinary protein is diagnosed as preeclampsia. °· Blood pressure measurements that stay above 160 systolic or 110 diastolic are a sign of severe preeclampsia. °TREATMENT °Treatment for hypertension during pregnancy varies. Treatment depends on the type of hypertension and how serious it is. °· If you take medicine for chronic hypertension, you may need to switch medicines. °¨ Medicines called ACE inhibitors should not be taken during pregnancy. °¨ Low-dose aspirin may be suggested for women who have risk factors for preeclampsia. °· If you have gestational hypertension, you may need to take a blood pressure medicine that is safe during pregnancy. Your health care provider will recommend the correct medicine. °· If you have severe preeclampsia, you may need to be in the hospital. Health care providers will watch you and your baby very closely. You also may need to take medicine called magnesium sulfate to prevent seizures and lower blood pressure. °· Sometimes, an early delivery is needed. This may be the case if the condition worsens. It would be   done to protect you and your baby. The only cure for preeclampsia is  delivery.  Your health care provider may recommend that you take one low-dose aspirin (81 mg) each day to help prevent high blood pressure during your pregnancy if you are at risk for preeclampsia. You may be at risk for preeclampsia if:  You had preeclampsia or eclampsia during a previous pregnancy.  Your baby did not grow as expected during a previous pregnancy.  You experienced preterm birth with a previous pregnancy.  You experienced a separation of the placenta from the uterus (placental abruption) during a previous pregnancy.  You experienced the loss of your baby during a previous pregnancy.  You are pregnant with more than one baby.  You have other medical conditions, such as diabetes or an autoimmune disease. HOME CARE INSTRUCTIONS  Schedule and keep all of your regular prenatal care appointments. This is important.  Take medicines only as directed by your health care provider. Tell your health care provider about all medicines you take.  Eat as little salt as possible.  Get regular exercise.  Do not drink alcohol.  Do not use tobacco products.  Do not drink products with caffeine.  Lie on your left side when resting. SEEK IMMEDIATE MEDICAL CARE IF:  You have severe abdominal pain.  You have sudden swelling in your hands, ankles, or face.  You gain 4 pounds (1.8 kg) or more in 1 week.  You vomit repeatedly.  You have vaginal bleeding.  You do not feel your baby moving as much.  You have a headache.  You have blurred or double vision.  You have muscle twitching or spasms.  You have shortness of breath.  You have blue fingernails or lips.  You have blood in your urine. MAKE SURE YOU:  Understand these instructions.  Will watch your condition.  Will get help right away if you are not doing well or get worse.   This information is not intended to replace advice given to you by your health care provider. Make sure you discuss any questions you  have with your health care provider.   Document Released: 11/08/2010 Document Revised: 03/13/2014 Document Reviewed: 09/19/2012 Elsevier Interactive Patient Education 2016 [employer].   Preterm Labor Information Preterm labor is when labor starts before you are [redacted] weeks pregnant. The normal length of pregnancy is 39 to 41 weeks.  CAUSES  The cause of preterm labor is not often known. The most common known cause is infection. RISK FACTORS  Having a history of preterm labor.  Having your water break before it should.  Having a placenta that covers the opening of the cervix.  Having a placenta that breaks away from the uterus.  Having a cervix that is too weak to hold the baby in the uterus.  Having too much fluid in the amniotic sac.  Taking drugs or smoking while pregnant.  Not gaining enough weight while pregnant.  Being younger than 70 and older than 38 years old.  Having a low income.  Being African American. SYMPTOMS 7. Period-like cramps, belly (abdominal) pain, or back pain. 8. Contractions that are regular, as often as six in an hour. They may be mild or painful. 9. Contractions that start at the top of the belly. They then move to the lower belly and back. 10. Lower belly pressure that seems to get stronger. 11. Bleeding from the vagina. 12. Fluid leaking from the vagina. TREATMENT  Treatment depends on:  Your condition.  The condition of your baby.  How many weeks pregnant you are. Your doctor may have you:  Take medicine to stop contractions.  Stay in bed except to use the restroom (bed rest).  Stay in the hospital. WHAT SHOULD YOU DO IF YOU THINK YOU ARE IN PRETERM LABOR? Call your doctor right away. You need to go to the hospital right away.  HOW CAN YOU PREVENT PRETERM LABOR IN FUTURE PREGNANCIES?  Stop smoking, if you smoke.  Maintain healthy weight gain.  Do not take drugs or be around chemicals that are not needed.  Tell your doctor  if you think you have an infection.  Tell your doctor if you had a preterm labor before.   This information is not intended to replace advice given to you by your health care provider. Make sure you discuss any questions you have with your health care provider.   Document Released: 05/19/2008 Document Revised: 07/07/2014 Document Reviewed: 03/25/2012 Elsevier Interactive Patient Education 2016 [employer].   Fetal Movement Counts Patient Name: __________________________________________________ Patient Due Date: ____________________ Performing a fetal movement count is highly recommended in high-risk pregnancies, but it is good for every pregnant woman to do. Your health care provider may ask you to start counting fetal movements at 28 weeks of the pregnancy. Fetal movements often increase:  After eating a full meal.  After physical activity.  After eating or drinking something sweet or cold.  At rest. Pay attention to when you feel the baby is most active. This will help you notice a pattern of your baby's sleep and wake cycles and what factors contribute to an increase in fetal movement. It is important to perform a fetal movement count at the same time each day when your baby is normally most active.  HOW TO COUNT FETAL MOVEMENTS 13. Find a quiet and comfortable area to sit or lie down on your left side. Lying on your left side provides the best blood and oxygen circulation to your baby. 14. Write down the day and time on a sheet of paper or in a journal. 15. Start counting kicks, flutters, swishes, rolls, or jabs in a 2-hour period. You should feel at least 10 movements within 2 hours. 16. If you do not feel 10 movements in 2 hours, wait 2-3 hours and count again. Look for a change in the pattern or not enough counts in 2 hours. SEEK MEDICAL CARE IF:  You feel less than 10 counts in 2 hours, tried twice.  There is no movement in over an hour.  The pattern is changing or taking  longer each day to reach 10 counts in 2 hours.  You feel the baby is not moving as he or she usually does. Date: ____________ Movements: ____________ Start time: ____________ Cindy Erickson time: ____________  Date: ____________ Movements: ____________ Start time: ____________ Cindy Erickson time: ____________ Date: ____________ Movements: ____________ Start time: ____________ Cindy Erickson time: ____________ Date: ____________ Movements: ____________ Start time: ____________ Cindy Erickson time: ____________ Date: ____________ Movements: ____________ Start time: ____________ Cindy Erickson time: ____________ Date: ____________ Movements: ____________ Start time: ____________ Cindy Erickson time: ____________ Date: ____________ Movements: ____________ Start time: ____________ Cindy Erickson time: ____________ Date: ____________ Movements: ____________ Start time: ____________ Cindy Erickson time: ____________  Date: ____________ Movements: ____________ Start time: ____________ Cindy Erickson time: ____________ Date: ____________ Movements: ____________ Start time: ____________ Cindy Erickson time: ____________ Date: ____________ Movements: ____________ Start time: ____________ Cindy Erickson time: ____________ Date: ____________ Movements: ____________ Start time: ____________ Cindy Erickson time: ____________ Date: ____________ Movements: ____________ Start time: ____________ Cindy Erickson time:  ____________ Date: ____________ Movements: ____________ Start time: ____________ Cindy Erickson time: ____________ Date: ____________ Movements: ____________ Start time: ____________ Cindy Erickson time: ____________  Date: ____________ Movements: ____________ Start time: ____________ Cindy Erickson time: ____________ Date: ____________ Movements: ____________ Start time: ____________ Cindy Erickson time: ____________ Date: ____________ Movements: ____________ Start time: ____________ Cindy Erickson time: ____________ Date: ____________ Movements: ____________ Start time: ____________ Cindy Erickson time: ____________ Date: ____________ Movements:  ____________ Start time: ____________ Cindy Erickson time: ____________ Date: ____________ Movements: ____________ Start time: ____________ Cindy Erickson time: ____________ Date: ____________ Movements: ____________ Start time: ____________ Cindy Erickson time: ____________  Date: ____________ Movements: ____________ Start time: ____________ Cindy Erickson time: ____________ Date: ____________ Movements: ____________ Start time: ____________ Cindy Erickson time: ____________ Date: ____________ Movements: ____________ Start time: ____________ Cindy Erickson time: ____________ Date: ____________ Movements: ____________ Start time: ____________ Cindy Erickson time: ____________ Date: ____________ Movements: ____________ Start time: ____________ Cindy Erickson time: ____________ Date: ____________ Movements: ____________ Start time: ____________ Cindy Erickson time: ____________ Date: ____________ Movements: ____________ Start time: ____________ Cindy Erickson time: ____________  Date: ____________ Movements: ____________ Start time: ____________ Cindy Erickson time: ____________ Date: ____________ Movements: ____________ Start time: ____________ Cindy Erickson time: ____________ Date: ____________ Movements: ____________ Start time: ____________ Cindy Erickson time: ____________ Date: ____________ Movements: ____________ Start time: ____________ Cindy Erickson time: ____________ Date: ____________ Movements: ____________ Start time: ____________ Cindy Erickson time: ____________ Date: ____________ Movements: ____________ Start time: ____________ Cindy Erickson time: ____________ Date: ____________ Movements: ____________ Start time: ____________ Cindy Erickson time: ____________  Date: ____________ Movements: ____________ Start time: ____________ Cindy Erickson time: ____________ Date: ____________ Movements: ____________ Start time: ____________ Cindy Erickson time: ____________ Date: ____________ Movements: ____________ Start time: ____________ Cindy Erickson time: ____________ Date: ____________ Movements: ____________ Start time: ____________ Cindy Erickson  time: ____________ Date: ____________ Movements: ____________ Start time: ____________ Cindy Erickson time: ____________ Date: ____________ Movements: ____________ Start time: ____________ Cindy Erickson time: ____________ Date: ____________ Movements: ____________ Start time: ____________ Cindy Erickson time: ____________  Date: ____________ Movements: ____________ Start time: ____________ Cindy Erickson time: ____________ Date: ____________ Movements: ____________ Start time: ____________ Cindy Erickson time: ____________ Date: ____________ Movements: ____________ Start time: ____________ Cindy Erickson time: ____________ Date: ____________ Movements: ____________ Start time: ____________ Cindy Erickson time: ____________ Date: ____________ Movements: ____________ Start time: ____________ Cindy Erickson time: ____________ Date: ____________ Movements: ____________ Start time: ____________ Cindy Erickson time: ____________ Date: ____________ Movements: ____________ Start time: ____________ Cindy Erickson time: ____________  Date: ____________ Movements: ____________ Start time: ____________ Cindy Erickson time: ____________ Date: ____________ Movements: ____________ Start time: ____________ Cindy Erickson time: ____________ Date: ____________ Movements: ____________ Start time: ____________ Cindy Erickson time: ____________ Date: ____________ Movements: ____________ Start time: ____________ Cindy Erickson time: ____________ Date: ____________ Movements: ____________ Start time: ____________ Cindy Erickson time: ____________ Date: ____________ Movements: ____________ Start time: ____________ Cindy Erickson time: ____________   This information is not intended to replace advice given to you by your health care provider. Make sure you discuss any questions you have with your health care provider.   Document Released: 03/22/2006 Document Revised: 03/13/2014 Document Reviewed: 12/18/2011 Elsevier Interactive Patient Education [employer].

## 2015-03-22 ENCOUNTER — Encounter (HOSPITAL_COMMUNITY): Payer: Self-pay | Admitting: *Deleted

## 2015-03-22 ENCOUNTER — Inpatient Hospital Stay (HOSPITAL_COMMUNITY)
Admission: AD | Admit: 2015-03-22 | Discharge: 2015-03-22 | Disposition: A | Discharge status: Home / Self Care | Payer: Medicaid Other | Source: Ambulatory Visit | Attending: Obstetrics and Gynecology | Admitting: Obstetrics and Gynecology

## 2015-03-22 DIAGNOSIS — O30042 Twin pregnancy, dichorionic/diamniotic, second trimester: Secondary | ICD-10-CM

## 2015-03-22 DIAGNOSIS — O30043 Twin pregnancy, dichorionic/diamniotic, third trimester: Secondary | ICD-10-CM | POA: Insufficient documentation

## 2015-03-22 DIAGNOSIS — O30049 Twin pregnancy, dichorionic/diamniotic, unspecified trimester: Secondary | ICD-10-CM | POA: Diagnosis present

## 2015-03-22 DIAGNOSIS — O10913 Unspecified pre-existing hypertension complicating pregnancy, third trimester: Secondary | ICD-10-CM | POA: Diagnosis not present

## 2015-03-22 DIAGNOSIS — Z3A36 36 weeks gestation of pregnancy: Secondary | ICD-10-CM | POA: Insufficient documentation

## 2015-03-22 DIAGNOSIS — Z3A00 Weeks of gestation of pregnancy not specified: Secondary | ICD-10-CM | POA: Diagnosis not present

## 2015-03-22 NOTE — MAU Note (Signed)
Sent by OB for NST on her twins; pt to have OB appointment follwing NST;

## 2015-03-23 ENCOUNTER — Other Ambulatory Visit: Payer: No Typology Code available for payment source

## 2015-03-30 ENCOUNTER — Ambulatory Visit (INDEPENDENT_AMBULATORY_CARE_PROVIDER_SITE_OTHER): Payer: No Typology Code available for payment source | Admitting: *Deleted

## 2015-03-30 VITALS — BP 128/83 | HR 93

## 2015-03-30 DIAGNOSIS — O30043 Twin pregnancy, dichorionic/diamniotic, third trimester: Secondary | ICD-10-CM

## 2015-03-30 NOTE — Progress Notes (Signed)
Copy of report and NST tracing sent to Dr. Grewal w/pt today.  

## 2015-04-02 ENCOUNTER — Inpatient Hospital Stay (HOSPITAL_COMMUNITY): Payer: Medicaid Other | Admitting: Anesthesiology

## 2015-04-02 ENCOUNTER — Inpatient Hospital Stay (HOSPITAL_COMMUNITY)
Admission: AD | Admit: 2015-04-02 | Discharge: 2015-04-06 | DRG: 765 | Disposition: A | Discharge status: Home / Self Care | Payer: Medicaid Other | Source: Ambulatory Visit | Attending: Obstetrics and Gynecology | Admitting: Obstetrics and Gynecology

## 2015-04-02 ENCOUNTER — Encounter (HOSPITAL_COMMUNITY): Payer: Self-pay | Admitting: *Deleted

## 2015-04-02 ENCOUNTER — Encounter (HOSPITAL_COMMUNITY)
Admission: AD | Disposition: A | Discharge status: Home / Self Care | Payer: Self-pay | Source: Ambulatory Visit | Attending: Obstetrics and Gynecology

## 2015-04-02 DIAGNOSIS — D252 Subserosal leiomyoma of uterus: Secondary | ICD-10-CM | POA: Diagnosis present

## 2015-04-02 DIAGNOSIS — Z833 Family history of diabetes mellitus: Secondary | ICD-10-CM | POA: Diagnosis not present

## 2015-04-02 DIAGNOSIS — O30003 Twin pregnancy, unspecified number of placenta and unspecified number of amniotic sacs, third trimester: Secondary | ICD-10-CM | POA: Diagnosis present

## 2015-04-02 DIAGNOSIS — Z8249 Family history of ischemic heart disease and other diseases of the circulatory system: Secondary | ICD-10-CM

## 2015-04-02 DIAGNOSIS — O1092 Unspecified pre-existing hypertension complicating childbirth: Secondary | ICD-10-CM | POA: Diagnosis present

## 2015-04-02 DIAGNOSIS — Z98891 History of uterine scar from previous surgery: Secondary | ICD-10-CM

## 2015-04-02 DIAGNOSIS — O3413 Maternal care for benign tumor of corpus uteri, third trimester: Secondary | ICD-10-CM | POA: Diagnosis present

## 2015-04-02 DIAGNOSIS — Z3A36 36 weeks gestation of pregnancy: Secondary | ICD-10-CM

## 2015-04-02 DIAGNOSIS — Z87891 Personal history of nicotine dependence: Secondary | ICD-10-CM

## 2015-04-02 DIAGNOSIS — O321XX1 Maternal care for breech presentation, fetus 1: Principal | ICD-10-CM | POA: Diagnosis present

## 2015-04-02 HISTORY — PX: MYOMECTOMY: SHX85

## 2015-04-02 LAB — CBC
HCT: 32.9 % — ABNORMAL LOW (ref 36.0–46.0)
Hemoglobin: 11.1 g/dL — ABNORMAL LOW (ref 12.0–15.0)
MCH: 30.4 pg (ref 26.0–34.0)
MCHC: 33.7 g/dL (ref 30.0–36.0)
MCV: 90.1 fL (ref 78.0–100.0)
PLATELETS: 248 10*3/uL (ref 150–400)
RBC: 3.65 MIL/uL — ABNORMAL LOW (ref 3.87–5.11)
RDW: 14.3 % (ref 11.5–15.5)
WBC: 12.2 10*3/uL — ABNORMAL HIGH (ref 4.0–10.5)

## 2015-04-02 LAB — TYPE AND SCREEN
ABO/RH(D): O POS
Antibody Screen: NEGATIVE

## 2015-04-02 LAB — RAPID HIV SCREEN (HIV 1/2 AB+AG)
HIV 1/2 ANTIBODIES: NONREACTIVE
HIV-1 P24 ANTIGEN - HIV24: NONREACTIVE

## 2015-04-02 SURGERY — MYOMECTOMY, ABDOMINAL APPROACH
Anesthesia: Spinal | Site: Uterus

## 2015-04-02 MED ORDER — TETANUS-DIPHTH-ACELL PERTUSSIS 5-2.5-18.5 LF-MCG/0.5 IM SUSP
0.5000 mL | Freq: Once | INTRAMUSCULAR | Status: AC
  Administered 2015-04-05: 0.5 mL via INTRAMUSCULAR
  Filled 2015-04-02: qty 0.5

## 2015-04-02 MED ORDER — FENTANYL CITRATE (PF) 100 MCG/2ML IJ SOLN
INTRAMUSCULAR | Status: AC
  Filled 2015-04-02: qty 2

## 2015-04-02 MED ORDER — HYDROCODONE-ACETAMINOPHEN 5-325 MG PO TABS
2.0000 | ORAL_TABLET | ORAL | Status: DC | PRN
Start: 2015-04-02 — End: 2015-04-06
  Administered 2015-04-03 – 2015-04-04 (×5): 2 via ORAL
  Filled 2015-04-02 (×5): qty 2

## 2015-04-02 MED ORDER — CITRIC ACID-SODIUM CITRATE 334-500 MG/5ML PO SOLN
30.0000 mL | Freq: Once | ORAL | Status: AC
  Administered 2015-04-02: 30 mL via ORAL
  Filled 2015-04-02: qty 15

## 2015-04-02 MED ORDER — OXYCODONE-ACETAMINOPHEN 5-325 MG PO TABS: 1.0000 | ORAL_TABLET | ORAL | Status: DC | PRN

## 2015-04-02 MED ORDER — DIPHENHYDRAMINE HCL 50 MG/ML IJ SOLN: 12.5000 mg | INTRAMUSCULAR | Status: DC | PRN

## 2015-04-02 MED ORDER — CEFAZOLIN SODIUM-DEXTROSE 2-3 GM-% IV SOLR
2.0000 g | INTRAVENOUS | Status: AC
  Administered 2015-04-02: 2 g via INTRAVENOUS
  Filled 2015-04-02: qty 50

## 2015-04-02 MED ORDER — FENTANYL CITRATE (PF) 100 MCG/2ML IJ SOLN: 25.0000 ug | INTRAMUSCULAR | Status: DC | PRN

## 2015-04-02 MED ORDER — MEPERIDINE HCL 25 MG/ML IJ SOLN: 6.2500 mg | INTRAMUSCULAR | Status: DC | PRN

## 2015-04-02 MED ORDER — LACTATED RINGERS IV SOLN
INTRAVENOUS | Status: DC
  Administered 2015-04-02 – 2015-04-03 (×2): via INTRAVENOUS

## 2015-04-02 MED ORDER — SCOPOLAMINE 1 MG/3DAYS TD PT72
1.0000 | MEDICATED_PATCH | Freq: Once | TRANSDERMAL | Status: AC
  Administered 2015-04-02: 1.5 mg via TRANSDERMAL

## 2015-04-02 MED ORDER — OXYCODONE-ACETAMINOPHEN 5-325 MG PO TABS: 2.0000 | ORAL_TABLET | ORAL | Status: DC | PRN

## 2015-04-02 MED ORDER — PRENATAL MULTIVITAMIN CH
1.0000 | ORAL_TABLET | Freq: Every day | ORAL | Status: DC
  Administered 2015-04-03 – 2015-04-06 (×4): 1 via ORAL
  Filled 2015-04-02 (×4): qty 1

## 2015-04-02 MED ORDER — KETOROLAC TROMETHAMINE 30 MG/ML IJ SOLN: 30.0000 mg | Freq: Four times a day (QID) | INTRAMUSCULAR | Status: DC | PRN

## 2015-04-02 MED ORDER — LACTATED RINGERS IV BOLUS (SEPSIS)
1000.0000 mL | Freq: Once | INTRAVENOUS | Status: AC
  Administered 2015-04-02: 1000 mL via INTRAVENOUS

## 2015-04-02 MED ORDER — SODIUM CHLORIDE 0.9 % IR SOLN
Status: DC | PRN
Start: 2015-04-02 — End: 2015-04-02
  Administered 2015-04-02: 1000 mL

## 2015-04-02 MED ORDER — SIMETHICONE 80 MG PO CHEW
80.0000 mg | CHEWABLE_TABLET | ORAL | Status: DC
  Administered 2015-04-02 – 2015-04-05 (×4): 80 mg via ORAL
  Filled 2015-04-02 (×4): qty 1

## 2015-04-02 MED ORDER — LABETALOL HCL 100 MG PO TABS
100.0000 mg | ORAL_TABLET | Freq: Once | ORAL | Status: AC
  Administered 2015-04-02: 100 mg via ORAL
  Filled 2015-04-02: qty 1

## 2015-04-02 MED ORDER — LANOLIN HYDROUS EX OINT: 1.0000 "application " | TOPICAL_OINTMENT | CUTANEOUS | Status: DC | PRN

## 2015-04-02 MED ORDER — DIPHENHYDRAMINE HCL 25 MG PO CAPS: 25.0000 mg | ORAL_CAPSULE | Freq: Four times a day (QID) | ORAL | Status: DC | PRN

## 2015-04-02 MED ORDER — WITCH HAZEL-GLYCERIN EX PADS
1.0000 | MEDICATED_PAD | CUTANEOUS | Status: DC | PRN
Start: 2015-04-02 — End: 2015-04-06

## 2015-04-02 MED ORDER — OXYTOCIN 10 UNIT/ML IJ SOLN
40.0000 [IU] | INTRAMUSCULAR | Status: DC | PRN
  Administered 2015-04-02: 40 [IU] via INTRAVENOUS

## 2015-04-02 MED ORDER — BUPIVACAINE HCL (PF) 0.25 % IJ SOLN
INTRAMUSCULAR | Status: AC
  Filled 2015-04-02: qty 30

## 2015-04-02 MED ORDER — FENTANYL CITRATE (PF) 100 MCG/2ML IJ SOLN
INTRAMUSCULAR | Status: DC | PRN
  Administered 2015-04-02: 20 ug via INTRATHECAL

## 2015-04-02 MED ORDER — IBUPROFEN 600 MG PO TABS: 600.0000 mg | ORAL_TABLET | Freq: Four times a day (QID) | ORAL | Status: DC | PRN

## 2015-04-02 MED ORDER — BUPIVACAINE IN DEXTROSE 0.75-8.25 % IT SOLN
INTRATHECAL | Status: DC | PRN
  Administered 2015-04-02: 10.5 mg via INTRATHECAL

## 2015-04-02 MED ORDER — KETOROLAC TROMETHAMINE 30 MG/ML IJ SOLN
INTRAMUSCULAR | Status: AC
  Administered 2015-04-02: 30 mg via INTRAMUSCULAR
  Filled 2015-04-02: qty 1

## 2015-04-02 MED ORDER — MENTHOL 3 MG MT LOZG: 1.0000 | LOZENGE | OROMUCOSAL | Status: DC | PRN

## 2015-04-02 MED ORDER — LACTATED RINGERS IV SOLN
INTRAVENOUS | Status: DC
  Administered 2015-04-02 (×3): via INTRAVENOUS

## 2015-04-02 MED ORDER — SIMETHICONE 80 MG PO CHEW
80.0000 mg | CHEWABLE_TABLET | Freq: Three times a day (TID) | ORAL | Status: DC
  Administered 2015-04-02 – 2015-04-06 (×11): 80 mg via ORAL
  Filled 2015-04-02 (×11): qty 1

## 2015-04-02 MED ORDER — HYDROCODONE-ACETAMINOPHEN 5-325 MG PO TABS
1.0000 | ORAL_TABLET | ORAL | Status: DC | PRN
Start: 2015-04-02 — End: 2015-04-06
  Administered 2015-04-03 – 2015-04-04 (×3): 1 via ORAL
  Filled 2015-04-02 (×3): qty 1

## 2015-04-02 MED ORDER — NALOXONE HCL 2 MG/2ML IJ SOSY: 1.0000 ug/kg/h | PREFILLED_SYRINGE | INTRAVENOUS | Status: DC | PRN

## 2015-04-02 MED ORDER — DIPHENHYDRAMINE HCL 25 MG PO CAPS
25.0000 mg | ORAL_CAPSULE | ORAL | Status: DC | PRN
  Filled 2015-04-02: qty 1

## 2015-04-02 MED ORDER — LACTATED RINGERS IV SOLN
INTRAVENOUS | Status: DC | PRN
  Administered 2015-04-02: 13:00:00 via INTRAVENOUS

## 2015-04-02 MED ORDER — SENNOSIDES-DOCUSATE SODIUM 8.6-50 MG PO TABS
2.0000 | ORAL_TABLET | ORAL | Status: DC
  Administered 2015-04-02 – 2015-04-05 (×4): 2 via ORAL
  Filled 2015-04-02 (×4): qty 2

## 2015-04-02 MED ORDER — PROMETHAZINE HCL 25 MG/ML IJ SOLN: 6.2500 mg | INTRAMUSCULAR | Status: DC | PRN

## 2015-04-02 MED ORDER — OXYTOCIN 10 UNIT/ML IJ SOLN: 2.5000 [IU]/h | INTRAVENOUS | Status: AC

## 2015-04-02 MED ORDER — ONDANSETRON HCL 4 MG/2ML IJ SOLN
INTRAMUSCULAR | Status: DC | PRN
  Administered 2015-04-02: 4 mg via INTRAVENOUS

## 2015-04-02 MED ORDER — ACETAMINOPHEN 325 MG PO TABS: 650.0000 mg | ORAL_TABLET | ORAL | Status: DC | PRN

## 2015-04-02 MED ORDER — SCOPOLAMINE 1 MG/3DAYS TD PT72
MEDICATED_PATCH | TRANSDERMAL | Status: AC
Start: 2015-04-02 — End: 2015-04-03
  Filled 2015-04-02: qty 1

## 2015-04-02 MED ORDER — ZOLPIDEM TARTRATE 5 MG PO TABS: 5.0000 mg | ORAL_TABLET | Freq: Every evening | ORAL | Status: DC | PRN

## 2015-04-02 MED ORDER — SIMETHICONE 80 MG PO CHEW
80.0000 mg | CHEWABLE_TABLET | ORAL | Status: DC | PRN
  Administered 2015-04-03: 80 mg via ORAL
  Filled 2015-04-02: qty 1

## 2015-04-02 MED ORDER — BUPIVACAINE HCL (PF) 0.25 % IJ SOLN
INTRAMUSCULAR | Status: DC | PRN
  Administered 2015-04-02: 20 mL

## 2015-04-02 MED ORDER — ONDANSETRON HCL 4 MG/2ML IJ SOLN
INTRAMUSCULAR | Status: AC
  Filled 2015-04-02: qty 2

## 2015-04-02 MED ORDER — KETOROLAC TROMETHAMINE 30 MG/ML IJ SOLN: 30.0000 mg | Freq: Once | INTRAMUSCULAR | Status: DC

## 2015-04-02 MED ORDER — SODIUM CHLORIDE 0.9% FLUSH: 3.0000 mL | INTRAVENOUS | Status: DC | PRN

## 2015-04-02 MED ORDER — ONDANSETRON HCL 4 MG/2ML IJ SOLN: 4.0000 mg | Freq: Three times a day (TID) | INTRAMUSCULAR | Status: DC | PRN

## 2015-04-02 MED ORDER — PHENYLEPHRINE 8 MG IN D5W 100 ML (0.08MG/ML) PREMIX OPTIME
INJECTION | INTRAVENOUS | Status: AC
  Filled 2015-04-02: qty 100

## 2015-04-02 MED ORDER — KETOROLAC TROMETHAMINE 30 MG/ML IJ SOLN
30.0000 mg | Freq: Four times a day (QID) | INTRAMUSCULAR | Status: DC | PRN
Start: 2015-04-02 — End: 2015-04-02
  Administered 2015-04-02: 30 mg via INTRAMUSCULAR

## 2015-04-02 MED ORDER — IBUPROFEN 600 MG PO TABS
600.0000 mg | ORAL_TABLET | Freq: Four times a day (QID) | ORAL | Status: DC
  Administered 2015-04-02 – 2015-04-06 (×15): 600 mg via ORAL
  Filled 2015-04-02 (×15): qty 1

## 2015-04-02 MED ORDER — OXYTOCIN 10 UNIT/ML IJ SOLN
INTRAMUSCULAR | Status: AC
Start: 2015-04-02 — End: 2015-04-02
  Filled 2015-04-02: qty 4

## 2015-04-02 MED ORDER — FAMOTIDINE IN NACL 20-0.9 MG/50ML-% IV SOLN
20.0000 mg | Freq: Once | INTRAVENOUS | Status: AC
  Administered 2015-04-02: 20 mg via INTRAVENOUS
  Filled 2015-04-02: qty 50

## 2015-04-02 MED ORDER — NALOXONE HCL 0.4 MG/ML IJ SOLN: 0.4000 mg | INTRAMUSCULAR | Status: DC | PRN

## 2015-04-02 MED ORDER — DIBUCAINE 1 % RE OINT
1.0000 "application " | TOPICAL_OINTMENT | RECTAL | Status: DC | PRN
  Filled 2015-04-02: qty 28

## 2015-04-02 MED ORDER — PHENYLEPHRINE 8 MG IN D5W 100 ML (0.08MG/ML) PREMIX OPTIME
INJECTION | INTRAVENOUS | Status: DC | PRN
Start: 2015-04-02 — End: 2015-04-02
  Administered 2015-04-02: 30 ug/min via INTRAVENOUS

## 2015-04-02 MED ORDER — MORPHINE SULFATE (PF) 0.5 MG/ML IJ SOLN
INTRAMUSCULAR | Status: AC
  Filled 2015-04-02: qty 10

## 2015-04-02 MED ORDER — MORPHINE SULFATE (PF) 0.5 MG/ML IJ SOLN
INTRAMUSCULAR | Status: DC | PRN
  Administered 2015-04-02: .2 mg via INTRATHECAL

## 2015-04-02 SURGICAL SUPPLY — 40 items
APL SKNCLS STERI-STRIP NONHPOA (GAUZE/BANDAGES/DRESSINGS) ×1
BARRIER ADHS 3X4 INTERCEED (GAUZE/BANDAGES/DRESSINGS) IMPLANT
BENZOIN TINCTURE PRP APPL 2/3 (GAUZE/BANDAGES/DRESSINGS) ×3 IMPLANT
BRR ADH 4X3 ABS CNTRL BYND (GAUZE/BANDAGES/DRESSINGS)
CLAMP CORD UMBIL (MISCELLANEOUS) IMPLANT
CLOSURE STERI STRIP 1/2 X4 (GAUZE/BANDAGES/DRESSINGS) ×3 IMPLANT
CLOTH BEACON ORANGE TIMEOUT ST (SAFETY) ×3 IMPLANT
CONTAINER PREFILL 10% NBF 15ML (MISCELLANEOUS) IMPLANT
DRAPE SHEET LG 3/4 BI-LAMINATE (DRAPES) IMPLANT
DRSG OPSITE POSTOP 4X10 (GAUZE/BANDAGES/DRESSINGS) ×3 IMPLANT
DURAPREP 26ML APPLICATOR (WOUND CARE) ×3 IMPLANT
ELECT REM PT RETURN 9FT ADLT (ELECTROSURGICAL) ×3
ELECTRODE REM PT RTRN 9FT ADLT (ELECTROSURGICAL) ×2 IMPLANT
EXTRACTOR VACUUM M CUP 4 TUBE (SUCTIONS) IMPLANT
GLOVE BIO SURGEON STRL SZ 6.5 (GLOVE) ×3 IMPLANT
GLOVE BIOGEL PI IND STRL 7.0 (GLOVE) ×2 IMPLANT
GLOVE BIOGEL PI INDICATOR 7.0 (GLOVE) ×1
GOWN STRL REUS W/TWL LRG LVL3 (GOWN DISPOSABLE) ×6 IMPLANT
KIT ABG SYR 3ML LUER SLIP (SYRINGE) IMPLANT
NEEDLE HYPO 22GX1.5 SAFETY (NEEDLE) IMPLANT
NEEDLE HYPO 25X5/8 SAFETYGLIDE (NEEDLE) ×3 IMPLANT
NS IRRIG 1000ML POUR BTL (IV SOLUTION) ×3 IMPLANT
PACK C SECTION WH (CUSTOM PROCEDURE TRAY) ×3 IMPLANT
PAD ABD 7.5X8 STRL (GAUZE/BANDAGES/DRESSINGS) ×3 IMPLANT
PAD OB MATERNITY 4.3X12.25 (PERSONAL CARE ITEMS) ×3 IMPLANT
PENCIL SMOKE EVAC W/HOLSTER (ELECTROSURGICAL) ×3 IMPLANT
SPONGE GAUZE 4X4 12PLY STER LF (GAUZE/BANDAGES/DRESSINGS) ×6 IMPLANT
SPONGE LAP 18X18 X RAY DECT (DISPOSABLE) ×3 IMPLANT
STRIP CLOSURE SKIN 1/2X4 (GAUZE/BANDAGES/DRESSINGS) ×3 IMPLANT
SUT CHROMIC 0 CTX 36 (SUTURE) ×6 IMPLANT
SUT PDS AB 1 CT  36 (SUTURE) ×2
SUT PDS AB 1 CT 36 (SUTURE) ×4 IMPLANT
SUT PLAIN 0 NONE (SUTURE) IMPLANT
SUT PLAIN 2 0 XLH (SUTURE) IMPLANT
SUT VIC AB 0 CT1 27 (SUTURE) ×9
SUT VIC AB 0 CT1 27XBRD ANBCTR (SUTURE) ×6 IMPLANT
SUT VIC AB 4-0 KS 27 (SUTURE) ×3 IMPLANT
SYR CONTROL 10ML LL (SYRINGE) IMPLANT
TOWEL OR 17X24 6PK STRL BLUE (TOWEL DISPOSABLE) ×3 IMPLANT
TRAY FOLEY CATH SILVER 14FR (SET/KITS/TRAYS/PACK) ×3 IMPLANT

## 2015-04-02 NOTE — Anesthesia Preprocedure Evaluation (Signed)
Anesthesia Evaluation  Patient identified by MRN, date of birth, ID band Patient awake    Reviewed: Allergy & Precautions, H&P , NPO status , Patient's Chart, lab work & pertinent test results  Airway Mallampati: I  TM Distance: >3 FB Neck ROM: full    Dental no notable dental hx.    Pulmonary former smoker,    Pulmonary exam normal        Cardiovascular hypertension, Pt. on medications Normal cardiovascular exam     Neuro/Psych negative neurological ROS  negative psych ROS   GI/Hepatic negative GI ROS, Neg liver ROS,   Endo/Other  negative endocrine ROS  Renal/GU negative Renal ROS     Musculoskeletal   Abdominal Normal abdominal exam  (+)   Peds  Hematology negative hematology ROS (+)   Anesthesia Other Findings   Reproductive/Obstetrics (+) Pregnancy                             Anesthesia Physical Anesthesia Plan  ASA: II  Anesthesia Plan: Spinal   Post-op Pain Management:    Induction:   Airway Management Planned:   Additional Equipment:   Intra-op Plan:   Post-operative Plan:   Informed Consent: I have reviewed the patients History and Physical, chart, labs and discussed the procedure including the risks, benefits and alternatives for the proposed anesthesia with the patient or authorized representative who has indicated his/her understanding and acceptance.     Plan Discussed with: CRNA and Surgeon  Anesthesia Plan Comments:         Anesthesia Quick Evaluation

## 2015-04-02 NOTE — H&P (Signed)
Cindy Erickson is a 40 year old G 1 P 0 at 36 weeks and 5 days with Twin gestation admitted in early labor. Contractions started lasted night - was 1 cm few days ago. Today in the office she was 3 + dilated with BBOW and breech.  Last po intake last night. RISK FACTORS: 1.  Chronic Hypertension 2.  Fibroids 3.  AMA Increased risk of aneuploidy  Risk of Downs 1:33  Refused amnio after meeting with MFM Maternal Medical History:  Reason for admission: Contractions.     OB History    Gravida Para Term Preterm AB TAB SAB Ectopic Multiple Living   1         0     Past Medical History  Diagnosis Date  . Heart murmur   . Fibroids   . Hypertension    Past Surgical History  Procedure Laterality Date  . No past surgeries     Family History: family history includes Cancer in her mother; Diabetes in her mother; Hypertension in her mother. Social History:  reports that she quit smoking about 4 years ago. She does not have any smokeless tobacco history on file. She reports that she does not drink alcohol or use illicit drugs.   Prenatal Transfer Tool  Maternal Diabetes: No Genetic Screening: Abnormal:  Results: Elevated risk of Trisomy 21 Maternal Ultrasounds/Referrals: Normal Fetal Ultrasounds or other Referrals:  None Maternal Substance Abuse:  No Significant Maternal Medications:  None Significant Maternal Lab Results:  None Other Comments:  None  Review of Systems  All other systems reviewed and are negative.     Blood pressure 145/93, pulse 83, temperature 98.3 F (36.8 C), temperature source Oral, resp. rate 16, last menstrual period 07/19/2014. Maternal Exam:  Uterine Assessment: Contraction frequency is regular.   Abdomen: Fetal presentation: vertex     Fetal Exam Fetal State Assessment: Category I - tracings are normal.     Physical Exam  Nursing note and vitals reviewed. Constitutional: She appears well-developed and well-nourished.  HENT:  Head:  Normocephalic.  Eyes: Pupils are equal, round, and reactive to light.  Neck: Normal range of motion.  Cardiovascular: Normal rate and regular rhythm.   Respiratory: Effort normal.    Prenatal labs: ABO, Rh: --/--/O POS, O POS (10/27 1819) Antibody: NEG (10/27 1819) Rubella:   RPR:    HBsAg:    HIV:    GBS:     Assessment/Plan: TWIN IUP at 36 w 5 days Early Labor Breech Proceed to C Section Risks reviewed  Consent signed.  Cindy Erickson L 04/02/2015, 11:49 AM

## 2015-04-02 NOTE — Lactation Note (Signed)
This note was copied from the chart of [name]. Lactation Consultation Note attempted visit at 5 hours of age.  Mom reports recent feeding for both babies and chart indicated LATCH scores of 5.  MBU RN arrived at bedside to assess mom who was also attempting to eat.  Babies are STS with FOB. LC resources information sheet and green late preterm baby education sheet given and asked FOB to read it.  LC placed colostrum containers at bedside and encouraged mom to pump soon.  Mom declines knowing how to hand express so will need follow up visit to explain LPT feeding plan for babies if RN does not before LCs next visit.      Patient Name: Cindy Erickson [id number] Date: 04/02/2015     Maternal Data    Feeding Feeding Type: Breast Fed (Simultaneous filing. User may not have seen previous data.) Length of feed: 10 min  LATCH Score/Interventions Latch: Repeated attempts needed to sustain latch, nipple held in mouth throughout feeding, stimulation needed to elicit sucking reflex. Intervention(s): Adjust position;Assist with latch;Breast compression;Breast massage  Audible Swallowing: None Intervention(s): Skin to skin;Hand expression  Type of Nipple: Everted at rest and after stimulation  Comfort (Breast/Nipple): Soft / non-tender     Hold (Positioning): Full assist, staff holds infant at breast  [facility] Score: 5  Lactation Tools Discussed/Used     Consult Status      Shoptaw, Justine Null 04/02/2015, 6:10 PM

## 2015-04-02 NOTE — Lactation Note (Signed)
This note was copied from the chart of [place]. Lactation Consultation Note Initial visit at 8 hours of age.  Baby Girl B last feeding was at 1745 for about 10 minutes with latch score of "5".  Assisted with hand expression of about 55mls.  Lc awakened baby for spoon feeding of 64ml EBM.  Baby latch to breast in football hold with wide open mouth and flanged lips.  Baby has some sucking bursts with strong dimpling noted.  No audible swallows, but bursts of good strong jay excursions noted.  LC fed baby 4mls of formula by bottle, baby tolerated well.  Paced feedings, volume guidelines discussed with parents.   Baby Boy A asleep on FOB.  Last feeding at 1930 for about 10 minutes.  Baby given a few drops of expressed colostrum by spoon from FOB.  Baby then placed STS for latching.  Baby latched breifly but does not maintain feeding well.  FOB gave baby Boy A 16mls of formula with a bottle and slow flow nipple.  Baby tolerated well.   Encouraged parents to wake baby every 2 1/2-3 hours for feedings or on demand with feeding cues.   Mom to hand express prior to latch and limit total feedings with supplement to 30 minutes.   Mom to post pump with DEBP for 15 min and then hand express.    Supplement baby with appropriate volume for hours of age.   Green late preterm baby sheet discussed with family  LC was not able to discuss all [place] services due to being overwhelmed with feedings/instructions.  Will need to discuss later.  Parents to call RN as needed if baby is missing a feeding. Encouraged to feed with early cues on demand.  Early newborn behavior discussed.  Hand expression demonstrated with colostrum visible.  Mom to call for assist as needed.    Patient Name: Cindy Erickson [id number] Date: 04/02/2015 Reason for consult: Initial assessment;Infant < 6lbs;Late preterm infant;Multiple gestation   Maternal Data Has patient been taught Hand Expression?: Yes Does the patient have  breastfeeding experience prior to this delivery?: No  Feeding Feeding Type: Breast Fed Length of feed: 10 min  LATCH Score/Interventions Latch: Repeated attempts needed to sustain latch, nipple held in mouth throughout feeding, stimulation needed to elicit sucking reflex. Intervention(s): Adjust position;Assist with latch;Breast massage;Breast compression  Audible Swallowing: A few with stimulation Intervention(s): Skin to skin;Hand expression;Alternate breast massage  Type of Nipple: Everted at rest and after stimulation  Comfort (Breast/Nipple): Soft / non-tender     Hold (Positioning): Full assist, staff holds infant at breast Intervention(s): Breastfeeding basics reviewed;Support Pillows;Position options;Skin to skin  LATCH Score: 6  Lactation Tools Discussed/Used Pump Review: Setup, frequency, and cleaning;Milk Storage Initiated by:: JS Date initiated:: 04/02/15   Consult Status Consult Status: Follow-up Date: 04/03/15 Follow-up type: In-patient    Shoptaw, Justine Null 04/02/2015, 10:14 PM

## 2015-04-02 NOTE — Anesthesia Postprocedure Evaluation (Signed)
Anesthesia Post Note  Patient: Cindy Erickson  Procedure(s) Performed: Procedure(s) (LRB): CESAREAN SECTION (N/A) MYOMECTOMY (N/A)  Patient location during evaluation: PACU Anesthesia Type: Spinal Level of consciousness: awake Pain management: pain level controlled Vital Signs Assessment: post-procedure vital signs reviewed and stable Respiratory status: spontaneous breathing Cardiovascular status: stable Postop Assessment: no headache, no backache, spinal receding, patient able to bend at knees and no signs of nausea or vomiting Anesthetic complications: no    Last Vitals:  Filed Vitals:   04/02/15 1330 04/02/15 1345  BP: 111/62 112/71  Pulse: 76 72  Temp:    Resp: 18 18    Last Pain:  Filed Vitals:   04/02/15 1350  PainSc: 0-No pain                 Meilani Edmundson JR,JOHN Maryam Feely

## 2015-04-02 NOTE — Anesthesia Procedure Notes (Signed)
Spinal Patient location during procedure: OR Start time: 04/02/2015 12:16 PM End time: 04/02/2015 12:18 PM Staffing Anesthesiologist: Lyn Hollingshead Performed by: anesthesiologist  Preanesthetic Checklist Completed: patient identified, surgical consent, pre-op evaluation, timeout performed, IV checked, risks and benefits discussed and monitors and equipment checked Spinal Block Patient position: sitting Prep: site prepped and draped and DuraPrep Patient monitoring: heart rate, cardiac monitor, continuous pulse ox and blood pressure Approach: midline Location: L3-4 Injection technique: single-shot Needle Needle type: Sprotte  Needle gauge: 24 G Needle length: 9 cm Needle insertion depth: 5 cm Assessment Sensory level: T4

## 2015-04-02 NOTE — Transfer of Care (Signed)
Immediate Anesthesia Transfer of Care Note  Patient: Cindy Erickson  Procedure(s) Performed: Procedure(s): CESAREAN SECTION (N/A) MYOMECTOMY (N/A)  Patient Location: PACU  Anesthesia Type:Spinal  Level of Consciousness: awake  Airway & Oxygen Therapy: Patient Spontanous Breathing  Post-op Assessment: Report given to RN  Post vital signs: Reviewed and stable  Last Vitals:  Filed Vitals:   04/02/15 1104 04/02/15 1151  BP: 145/93 144/95  Pulse: 83 93  Temp: 36.8 C   Resp: 16     Complications: No apparent anesthesia complications

## 2015-04-02 NOTE — MAU Note (Signed)
Pt went to the office and Dr. Helane Rima told her she was in labor and needed to come to the hospital and have her c-section today.

## 2015-04-02 NOTE — Brief Op Note (Signed)
04/02/2015  1:12 PM  PATIENT:  Cindy Erickson  40 y.o. female  PRE-OPERATIVE DIAGNOSIS:   twin IUP at 36.5 weeks labor breech presentation  POST-OPERATIVE DIAGNOSIS:   twin IUP at 36.5 weeks labor breech presentation uterine fibroids  PROCEDURE:  Procedure(s): CESAREAN SECTION (N/A) MYOMECTOMY (N/A)  SURGEON:  Surgeon(s) and Role:    * Dian Queen, MD - Primary  PHYSICIAN ASSISTANT:   ASSISTANTS: none   ANESTHESIA:   spinal  EBL:  Total I/O In: 2800 [I.V.:2800] Out: 1450 [Urine:250; Blood:1200]  BLOOD ADMINISTERED:none  DRAINS: Urinary Catheter (Foley)   LOCAL MEDICATIONS USED:  LIDOCAINE   SPECIMEN:  Source of Specimen:  placentas x 2  DISPOSITION OF SPECIMEN:  PATHOLOGY  COUNTS:  YES  TOURNIQUET:  * No tourniquets in log *  DICTATION: .Other Dictation: Dictation Number [id number]  PLAN OF CARE: Admit to inpatient   PATIENT DISPOSITION:  PACU - hemodynamically stable.   Delay start of Pharmacological VTE agent (>24hrs) due to surgical blood loss or risk of bleeding: not applicable

## 2015-04-03 LAB — CBC
HEMATOCRIT: 26.2 % — AB (ref 36.0–46.0)
HEMOGLOBIN: 9 g/dL — AB (ref 12.0–15.0)
MCH: 30.8 pg (ref 26.0–34.0)
MCHC: 34.4 g/dL (ref 30.0–36.0)
MCV: 89.7 fL (ref 78.0–100.0)
Platelets: 192 10*3/uL (ref 150–400)
RBC: 2.92 MIL/uL — ABNORMAL LOW (ref 3.87–5.11)
RDW: 14 % (ref 11.5–15.5)
WBC: 16 10*3/uL — AB (ref 4.0–10.5)

## 2015-04-03 LAB — RPR: RPR: NONREACTIVE

## 2015-04-03 NOTE — Progress Notes (Signed)
S:  Patient is doing well.  No complaints.  O:  BP 120/58 mmHg  Pulse 69  Temp(Src) 98.7 F (37.1 C) (Oral)  Resp 20  SpO2 98%  LMP 07/19/2014  Breastfeeding? Unknown Results for orders placed or performed during the hospital encounter of 04/02/15 (from the past 24 hour(s))  Rapid HIV screen (HIV 1/2 Ab+Ag)     Status: None   Collection Time: 04/02/15 11:15 AM  Result Value Ref Range   HIV-1 P24 Antigen - HIV24 NON REACTIVE NON REACTIVE   HIV 1/2 Antibodies NON REACTIVE NON REACTIVE   Interpretation (HIV Ag Ab)      A non reactive test result means that HIV 1 or HIV 2 antibodies and HIV 1 p24 antigen were not detected in the specimen.  CBC     Status: Abnormal   Collection Time: 04/02/15 11:15 AM  Result Value Ref Range   WBC 12.2 (H) 4.0 - 10.5 K/uL   RBC 3.65 (L) 3.87 - 5.11 MIL/uL   Hemoglobin 11.1 (L) 12.0 - 15.0 g/dL   HCT 32.9 (L) 36.0 - 46.0 %   MCV 90.1 78.0 - 100.0 fL   MCH 30.4 26.0 - 34.0 pg   MCHC 33.7 30.0 - 36.0 g/dL   RDW 14.3 11.5 - 15.5 %   Platelets 248 150 - 400 K/uL  RPR     Status: None   Collection Time: 04/02/15 11:15 AM  Result Value Ref Range   RPR Ser Ql Non Reactive Non Reactive  Type and screen [place]     Status: None   Collection Time: 04/02/15 11:15 AM  Result Value Ref Range   ABO/RH(D) O POS    Antibody Screen NEG    Sample Expiration 04/05/2015   CBC     Status: Abnormal   Collection Time: 04/03/15  5:16 AM  Result Value Ref Range   WBC 16.0 (H) 4.0 - 10.5 K/uL   RBC 2.92 (L) 3.87 - 5.11 MIL/uL   Hemoglobin 9.0 (L) 12.0 - 15.0 g/dL   HCT 26.2 (L) 36.0 - 46.0 %   MCV 89.7 78.0 - 100.0 fL   MCH 30.8 26.0 - 34.0 pg   MCHC 34.4 30.0 - 36.0 g/dL   RDW 14.0 11.5 - 15.5 %   Platelets 192 150 - 400 K/uL   Abdomen is soft and non tender No drainage from incision  IMPRESSION: POD #1 Doing well Routine care

## 2015-04-03 NOTE — Lactation Note (Signed)
This note was copied from the chart of [place]. Lactation Consultation Note  Follow up visit made.  Assisted with latch attempt.  Baby girl is having difficulty sustaining latch then becomes sleepy.  Baby Cindy latches but falling asleep after a few minutes.  Reassured mom that these are normal late preterm behaviors and feedings should gradually improve as babies reach term.  Instructed to increase supplement to 15-20 mls every 3 hours.  DEBP encouraged every 3 hours but mom seems somewhat overwhelmed.  She does have [place] and referral faxed to [facility] office for pump.  Discussed [facility] loaner program.  Patient Name: Cindy Erickson [id number] Date: 04/03/2015 Reason for consult: Follow-up assessment;Multiple gestation;Infant < 6lbs;Late preterm infant   Maternal Data    Feeding Feeding Type: Breast Fed Length of feed: 5 min  LATCH Score/Interventions Latch: Repeated attempts needed to sustain latch, nipple held in mouth throughout feeding, stimulation needed to elicit sucking reflex. Intervention(s): Adjust position;Assist with latch;Breast massage;Breast compression  Audible Swallowing: A few with stimulation Intervention(s): Hand expression;Alternate breast massage  Type of Nipple: Everted at rest and after stimulation  Comfort (Breast/Nipple): Soft / non-tender     Hold (Positioning): Assistance needed to correctly position infant at breast and maintain latch. Intervention(s): Breastfeeding basics reviewed;Support Pillows;Position options  LATCH Score: 7  Lactation Tools Discussed/Used     Consult Status Consult Status: Follow-up Date: 04/04/15 Follow-up type: In-patient    Cindy Erickson 04/03/2015, 2:31 PM

## 2015-04-03 NOTE — Op Note (Signed)
Cindy Erickson, HUDMAN            ACCOUNT NO.:  [id number]  MEDICAL RECORD NO.:  [place]:[id number]  LOCATION:  9106                          FACILITY:  [facility]  PHYSICIAN:  Trammell Bowden L. Ashliegh Parekh, M.D.DATE OF BIRTH:  08-07-1975  DATE OF PROCEDURE:  04/02/2015 DATE OF DISCHARGE:                              OPERATIVE REPORT   PREOPERATIVE DIAGNOSES:  Twin intrauterine pregnancy at 36 weeks and 5 days, labor, breech presentation, chronic hypertension, and fibroids.  POSTOPERATIVE DIAGNOSES:  Twin intrauterine pregnancy at 36 weeks and 5 days, labor, breech presentation, chronic hypertension, and fibroids.  PROCEDURE:  Primary low transverse cesarean section and myomectomy.  SURGEON:  Cindy Zea L. Jaliel Deavers, MD  ANESTHESIA:  Spinal.  EBL:  1200 mL.  URINE OUTPUT:  250 mL.  COMPLICATIONS:  None.  PATHOLOGY:  Placenta x2.  PROCEDURE IN DETAIL:  Patient was taken to the operating room.  Her spinal was placed and found to be adequate.  She was prepped and draped. A Foley catheter was inserted.  After a sterile drape was applied, a low transverse reverse incision was made, carried down to the fascia and extended laterally.  The rectus muscles were separated in the midline. The peritoneum was entered bluntly.  The peritoneal incision was stretched.  The bladder blade was inserted.  The lower uterine segment was well developed.  We created bladder flap sharply and then digitally in standard fashion.  We adjusted the bladder blade and uterus was entered using a hemostat.  Baby A was in frank breech presentation and we delivered via complete breech extraction very easily with a female infant, and was handed to the awaiting neonatal team after the cord was clamped and cut.  Baby B sac came down and the baby B was head 1st.  We performed amniotomy of clear fluid.  The vertex was easily seen and baby B was delivered, was a female infant, easily and the cord was clamped and cut.  The baby was handed to the  neonatal team.  We then manually removed the placentas and making sure that the uterine cavity was clean. I then exteriorized the uterus, which was very difficult because she had multiple fibroids.  Her uterus had 1 fibroid in the right broad ligament that was maybe 10-12 cm in diameter.  There were several subserosal fibroids and there were some several knobby fibroids.  There was 1 fibroid at the top of the fundus that was bleeding at its base.  So, I elected to remove that one.  We removed it with the Bovie and then closed that with a PDS.  Hemostasis was then very good.  We then closed the incision.  As I was closing the incision, the nurse anesthetist informed me that there was an episode of some hematuria which then resolved and it was clear.  I closed the incision using 0 chromic in a running, locked stitch.  I then investigated and realized that our incision was well above where the bladder was and I felt there was no injury to the bladder.  We performed irrigation and I again inspected the area near the bladder which appeared to look good and the incision was very hemostatic.  We returned the uterus  to the abdomen.  Irrigation was performed again.  Hemostasis was very good.  The peritoneum was closed using 0 Vicryl.  The fascia was closed using 0 Vicryl starting each corner meeting in the midline.  After irrigation of the subcutaneous layer, the skin was closed with 4-0 Vicryl on a Keith needle.  Steri-Strips were applied.  Local was infiltrated.  A bandage was applied.  All sponge, lap, and instrument counts were correct x2. Patient went to recovery room in stable condition.     Cindy Erickson L. Helane Rima, M.D.     Nevin Bloodgood  D:  04/02/2015  T:  04/03/2015  Job:  [id number]

## 2015-04-03 NOTE — Plan of Care (Signed)
Problem: Pain Management: Goal: General experience of comfort will improve and pain level will decrease Outcome: Not Progressing Incisional pain has increased during the day. Patient encouraged to take Hydrocodone as needed to better control incisional pain.  Problem: Bowel/Gastric: Goal: Gastrointestinal status will improve Outcome: Not Progressing Not yet passing flatus. Ambulation encouraged.

## 2015-04-03 NOTE — Plan of Care (Signed)
Problem: Coping: Goal: Ability to cope will improve Outcome: Not Progressing Pt. Had many visitors today. She has only received 1-2 hours of sleep over the past 24 hours. Parents encouraged to prioritize sleep/rest and infant care and self-care.

## 2015-04-04 NOTE — Progress Notes (Signed)
S:  Patient without complaints.  O:  BP 132/78 mmHg  Pulse 78  Temp(Src) 98.3 F (36.8 C) (Oral)  Resp 18  SpO2 100%  LMP 07/19/2014  Breastfeeding? Unknown No results found for this or any previous visit (from the past 24 hour(s)). Abdomen is soft and non tender No incisional drainage  IMPRESSION: POD #2 Doing well Routine care Patient desires circ tomorrow given Baby A losing weight

## 2015-04-04 NOTE — Progress Notes (Signed)
Patient states she had not had tdap vaccine, she is undecided at this time if she wants it. She will let me know if she decides.

## 2015-04-04 NOTE — Clinical Social Work Maternal (Signed)
  CLINICAL SOCIAL WORK MATERNAL/CHILD NOTE  Patient Details  Name: Cindy Erickson MRN: [id number] Date of Birth: 22-May-1975  Date:  04/04/2015  Clinical Social Worker Initiating Note:  Norlene Duel, LCSw Date/ Time Initiated:  04/04/15/1011     Child's Name:  [name], and Twin B: Public affairs consultant Guardian:   (Parents Haematologist and Shenice Carthins)   Need for Interpreter:  None   Date of Referral:  03/27/15     Reason for Referral:  Other (Comment)   Referral Source:  [facility]   Address:  [place], [place]  Phone number:   [phone])   Household Members:  Significant Other   Natural Supports (not living in the home):  Extended Family, Immediate Family   Professional Supports: None   Employment:  (FOB is employed )   Type of Work:  (Mother currently not employed)   Education:  Geophysical data processor Resources:  Medicaid   Other Resources:  Physicist, medical , [place] Considerations Which May Impact Care:  none noted  Strengths:  Ability to meet basic needs , Home prepared for child    Risk Factors/Current Problems:  None   Cognitive State:  Able to Concentrate , Alert    Mood/Affect:  Happy    CSW Assessment: Acknowledged order for social work consult to assess mother's hx of Depression and anxiety. Met with mother who was pleasant and receptive to social work. FOB was present and very attentive to mother and the twins.   MOB reports hx of anxiety and depression.  Informed that she was treated with medication mainly for the anxiety 10 years ago, and again a year ago after she was laid-off from work.     She denies any current symptoms of depression or anxiety. She denies any use of alcohol or illicit drug use during pregnancy.   FOB is employed and reports plan to take off about a month to support mother and the twins at home.  Parents don't have a lot of local support.   Parents spoke  with excitement about the twins. No acute social concerns noted or reported at this time.  Parents informed of social work Fish farm manager.  Discussed signs/symptoms of PP Depression and available resources No further intervention required No barriers to discharge   Macallan Ord J, LCSW 04/04/2015, 10:16 AM

## 2015-04-04 NOTE — Progress Notes (Signed)
Encouraged mother to use DEBP q3 hrs after infant feedings.

## 2015-04-05 ENCOUNTER — Encounter (HOSPITAL_COMMUNITY): Payer: Self-pay | Admitting: Obstetrics and Gynecology

## 2015-04-05 MED ORDER — BISACODYL 10 MG RE SUPP
10.0000 mg | Freq: Once | RECTAL | Status: AC
  Administered 2015-04-05: 10 mg via RECTAL
  Filled 2015-04-05: qty 1

## 2015-04-05 MED ORDER — FLEET ENEMA 7-19 GM/118ML RE ENEM
1.0000 | ENEMA | Freq: Once | RECTAL | Status: AC
  Administered 2015-04-05: 1 via RECTAL

## 2015-04-05 MED ORDER — OXYCODONE-ACETAMINOPHEN 5-325 MG PO TABS
1.0000 | ORAL_TABLET | ORAL | Status: DC | PRN
  Administered 2015-04-05 – 2015-04-06 (×5): 2 via ORAL
  Filled 2015-04-05 (×5): qty 2

## 2015-04-05 NOTE — Progress Notes (Signed)
UR chart review completed.  

## 2015-04-05 NOTE — Lactation Note (Signed)
This note was copied from the chart of [place]. Lactation Consultation Note  Patient Name: Boy Dandria Puente [id number] Date: 04/05/2015   Visited with Mom, babies 63 hrs old. Baby are primarily being bottle fed supplemental formula by slow flow nipple.  Babies occasionally put to breast, will latch but falls asleep and no milk swallowing heard or felt per Mom.  Reassured Mom that this was WNL for baby's gestational age, and small size.  Mom hasn't pumped since this am, and encouraged Mom to continue with breast massage and manual expression prior to pumping.   Mom to ask for help as needed with latching baby.  To follow up in am.      Broadus John 04/05/2015, 2:53 PM

## 2015-04-05 NOTE — Progress Notes (Signed)
Subjective: Postpartum Day 3: Cesarean Delivery Patient reports incisional pain, tolerating PO, + flatus and no problems voiding.  Infant feeding improving with infant weight gain noted  Objective: Vital signs in last 24 hours: Temp:  [97.9 F (36.6 C)-98.7 F (37.1 C)] 97.9 F (36.6 C) (01/30 0609) Pulse Rate:  [73-77] 77 (01/30 0609) Resp:  [16-18] 16 (01/30 0609) BP: (130-143)/(82-86) 130/82 mmHg (01/30 0609) SpO2:  [100 %] 100 % (01/30 [id number])  Physical Exam:  General: alert and cooperative Lochia: appropriate Uterine Fundus: firm Incision: healing well DVT Evaluation: No evidence of DVT seen on physical exam. Negative Homan's sign. No cords or calf tenderness. Calf/Ankle edema is present. DTR's 1-2+   Recent Labs  04/02/15 1115 04/03/15 0516  HGB 11.1* 9.0*  HCT 32.9* 26.2*    Assessment/Plan: Status post Cesarean section. Doing well postoperatively.  Plan for discharge in am.  CURTIS,CAROL G 04/05/2015, 8:09 AM

## 2015-04-06 ENCOUNTER — Other Ambulatory Visit: Payer: No Typology Code available for payment source

## 2015-04-06 MED ORDER — OXYCODONE-ACETAMINOPHEN 5-325 MG PO TABS: 1.0000 | ORAL_TABLET | ORAL | Status: AC | PRN

## 2015-04-06 MED ORDER — IBUPROFEN 600 MG PO TABS: 600.0000 mg | ORAL_TABLET | Freq: Four times a day (QID) | ORAL | Status: AC | PRN

## 2015-04-06 NOTE — Discharge Summary (Signed)
Obstetric Discharge Summary Reason for Admission: onset of labor Prenatal Procedures: ultrasound Intrapartum Procedures: cesarean: low cervical, transverse Postpartum Procedures: none Complications-Operative and Postpartum: none HEMOGLOBIN  Date Value Ref Range Status  04/03/2015 9.0* 12.0 - 15.0 g/dL Final   HCT  Date Value Ref Range Status  04/03/2015 26.2* 36.0 - 46.0 % Final    Physical Exam:  General: alert and cooperative Lochia: appropriate Uterine Fundus: firm Incision: healing well DVT Evaluation: No evidence of DVT seen on physical exam. Negative Homan's sign. No cords or calf tenderness. Calf/Ankle edema is present.  Discharge Diagnoses: Term Pregnancy-delivered  Discharge Information: Date: 04/06/2015 Activity: pelvic rest Diet: routine Medications: PNV, Ibuprofen and Percocet Condition: stable Instructions: refer to practice specific booklet Discharge to: home   Newborn Data:   Nicci, Mosser [id number]  Live born female  Birth Weight: 5 lb 6.4 oz (2450 g) APGAR: 9, 9   Rasheika, Madyun [id number]  Live born female  Birth Weight: 4 lb 9.4 oz (2080 g) APGAR: 8, 9  Home with mother.  CURTIS,CAROL G 04/06/2015, 8:18 AM

## 2015-04-06 NOTE — Lactation Note (Signed)
This note was copied from the chart of [place]. Lactation Consultation Note:  Mother is pumping and bottle feeding with ebm and formula. Baby A was fed 14 ml of ebm and 15 ml of formula. Mother just finished feeding Baby B 13 ml of ebm. Mother was fit with a #24 nipple shield. Baby B latched well with assistance for 15 mins. Audible swallows present with good sustained suckling. Mother very happy that she is producing milk and that infant is breastfeeding. Baby B was fed 15 ml of formula after breastfeeding.   Advised mother in proper placement of nipple shield. Reviewed proper latch using a nipple shield. Mother was given a pump rental packet. She will page when she is ready for discharge. She plans to breastfeed Baby A with assistance before discharge.  A WIC referral was sent on 1/28 . Mother states that [facility] phoned her back and she has an appt. Mother plans to follow up with [facility] services on Feb 17 . Mother advised to phone [facility] office and get an earlier appt .   Patient Name: Cindy Erickson [id number] Date: 04/06/2015 Reason for consult: Follow-up assessment   Maternal Data    Feeding Feeding Type: Formula  LATCH Score/Interventions Latch: Grasps breast easily, tongue down, lips flanged, rhythmical sucking. Intervention(s): Adjust position;Assist with latch;Breast massage;Breast compression  Audible Swallowing: Spontaneous and intermittent Intervention(s): Hand expression Intervention(s): Hand expression  Type of Nipple: Everted at rest and after stimulation  Comfort (Breast/Nipple): Filling, red/small blisters or bruises, mild/mod discomfort  Problem noted: Filling Interventions (Filling): Firm support;Frequent nursing;Double electric pump  Hold (Positioning): Assistance needed to correctly position infant at breast and maintain latch. Intervention(s): Support Pillows;Position options  LATCH Score: 8  Lactation Tools Discussed/Used Tools: Nipple  Shields Nipple shield size: 24   Consult Status Consult Status: Follow-up Date: 04/23/15 Follow-up type: Out-patient    Jess Barters [facility] 04/06/2015, 10:36 AM

## 2015-04-07 ENCOUNTER — Inpatient Hospital Stay (HOSPITAL_COMMUNITY)
Admission: RE | Admit: 2015-04-07 | Discharge: 2015-04-07 | Disposition: A | Discharge status: Home / Self Care | Payer: No Typology Code available for payment source | Source: Ambulatory Visit

## 2015-04-09 ENCOUNTER — Encounter (HOSPITAL_COMMUNITY): Admission: RE | Payer: Self-pay | Source: Ambulatory Visit

## 2015-04-09 ENCOUNTER — Inpatient Hospital Stay (HOSPITAL_COMMUNITY)
Admission: RE | Admit: 2015-04-09 | Payer: No Typology Code available for payment source | Source: Ambulatory Visit | Admitting: Obstetrics and Gynecology

## 2015-04-09 SURGERY — Surgical Case
Anesthesia: Regional

## 2015-04-23 ENCOUNTER — Ambulatory Visit (HOSPITAL_COMMUNITY): Payer: Medicaid Other

## 2015-04-29 ENCOUNTER — Ambulatory Visit (HOSPITAL_COMMUNITY): Payer: Medicaid Other

## 2015-04-29 ENCOUNTER — Ambulatory Visit (HOSPITAL_COMMUNITY)
Admission: RE | Admit: 2015-04-29 | Discharge: 2015-04-29 | Disposition: A | Discharge status: Home / Self Care | Payer: Medicaid Other | Source: Ambulatory Visit | Attending: Obstetrics and Gynecology | Admitting: Obstetrics and Gynecology

## 2015-04-29 NOTE — Lactation Note (Addendum)
Lactation Consult for [employer] (mother) and Cindy Erickson & Chiropractor (DOB: [birth year])  Mother's reason for visit: "breastfeeding education" Consult:  Initial Lactation Consultant:  Cindy Erickson  ________________________________________________________________________ Cindy Erickson BW: 5# 6.4 oz (2450g)  BW: 2080g (4# 9.4oz 04-19-15: 5# 11oz    04-19-15: 5# 0 oz Today's weight: 5# 9.9oz  Today's weight: 5# 2.9oz  ________________________________________________________________________  Mother's Name: Cindy Erickson Type of delivery:  C/S Breastfeeding Experience:  primip Maternal Medical Conditions:  HTN; Anx/Depr Maternal Medications: labetalol (L2)  ________________________________________________________________________  Breastfeeding History (Post Discharge) Frequency of breastfeeding: bid (twins) Duration of feeding: 15-21min  Supplementation Breastmilk:  Volume 40-50 ml Frequency: 8x/day Total volume per day: 362ml - 451mL  Method:  [place] (68min or less for both babies)  Pumping q3-4hrs; 136mL/session  Infant Intake and Output Assessment Voids: 8-10 in 24 hrs.  Color:  Clear yellow Stools: 4-5 in 24 hrs.  Color:  Yellow  ________________________________________________________________________  Maternal Breast Assessment  Breast:  Full Nipple:  Erect. Nipples are intact, but feel sore to Mom. Comfort Gels provided.  _______________________________________________________________________ Feeding Assessment/Evaluation  Initial feeding assessment:  Infant's oral assessment:  WNL  Attached assessment:  Deep  Lips flanged:  Yes.    Suck assessment:  Nutritive and Displays both  Tools:  Nipple shield 24 mm Instructed on use and cleaning of tool:  Yes.    Cindy Erickson Pre-feed weight: 2550 g   Post-feed weight: 2578 g  Amount transferred: 28 ml in 20 min  R breast, football hold,  Amount supplemented: 80 ml  Cindy Erickson Pre-feed weight:  2350 g   Post-feed weight: 2378g Amount transferred: 28 ml in 14 min L breast, cross-cradle Amount supplemented: 75 ml  Total amount transferred: 28 ml (Cindy Erickson) & 28 mL (Cindy Erickson) Total supplement given: 16ml (Cindy Erickson) & 75 mL (Cindy Erickson)   Cindy Erickson & Cindy Erickson are almost 51 month old. Cindy Erickson has lost weight (1 oz) since his last weight check 10 days ago. Cindy Erickson has only gained 2.9 oz since her last weight check 10 days ago.   Cindy Erickson & Cindy Erickson latch w/relative ease to their Mom's breast wearing a nipple shield (size 24). Mom puts each of them to the breast bid. Each of them only transferred 77mL, but then showed signs of contentment. However, because of their weight loss/insufficient weight gain, I encouraged parents to go ahead and supplement after the breastfeeding and each baby took 10ml-80ml more.   Parents were very receptive to the conversation about increasing the amount of their babies' feeds. They had been trying to limit baby's intake b/c they were concerned about overfeeding them (parents remembered that they had been told 40-56mL/bottle feeding and that their babies' stomachs were only as big as their fists).    Parents now understand the importance of feeding to satiety. Both babies took more supplement than usual & showed signs of contentment & did not spit up.  In addition to feeding to satiety, parents encouraged to keep a feeding record between now & their next peds visit, which is on Mon, Feb 27th.   Mom has an abundant milk supply and pumps 161ml/pumping session. Mom encouraged to pump q3h except at night. Mom reminded of BF support groups. Mom knows to call if any questions/concerns.  Cindy Dodge, RN, IBCLC

## 2015-10-01 ENCOUNTER — Other Ambulatory Visit: Payer: Self-pay | Admitting: Obstetrics and Gynecology

## 2015-10-01 DIAGNOSIS — N6314 Unspecified lump in the right breast, lower inner quadrant: Secondary | ICD-10-CM

## 2015-10-11 ENCOUNTER — Other Ambulatory Visit: Payer: Self-pay | Admitting: Obstetrics and Gynecology

## 2015-10-11 ENCOUNTER — Ambulatory Visit
Admission: RE | Admit: 2015-10-11 | Discharge: 2015-10-11 | Disposition: A | Discharge status: Home / Self Care | Payer: Medicaid Other | Source: Ambulatory Visit | Attending: Obstetrics and Gynecology | Admitting: Obstetrics and Gynecology

## 2015-10-11 DIAGNOSIS — N6314 Unspecified lump in the right breast, lower inner quadrant: Secondary | ICD-10-CM

## 2015-10-15 ENCOUNTER — Ambulatory Visit
Admission: RE | Admit: 2015-10-15 | Discharge: 2015-10-15 | Disposition: A | Discharge status: Home / Self Care | Payer: Medicaid Other | Source: Ambulatory Visit | Attending: Obstetrics and Gynecology | Admitting: Obstetrics and Gynecology

## 2015-10-15 DIAGNOSIS — N6314 Unspecified lump in the right breast, lower inner quadrant: Secondary | ICD-10-CM

## 2016-12-19 IMAGING — US US MFM OB TRANSVAGINAL
1 series · 15 of 28 positions shown · non-contrast
Comparison: none

[Series 1: us mfm ob transvaginal · 43 acquisitions, 15 frames shown]
[im 1/43]
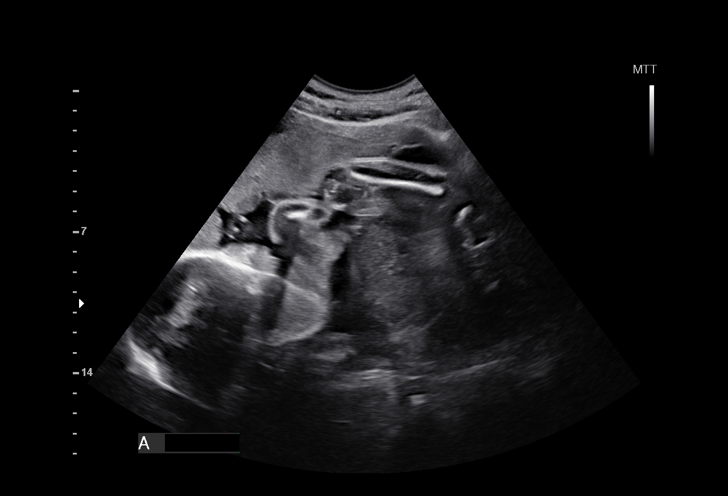
[im 4/43]
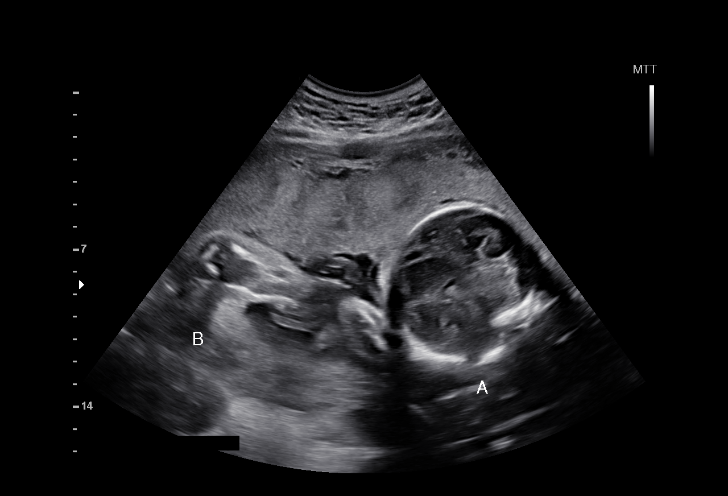
[im 7/43]
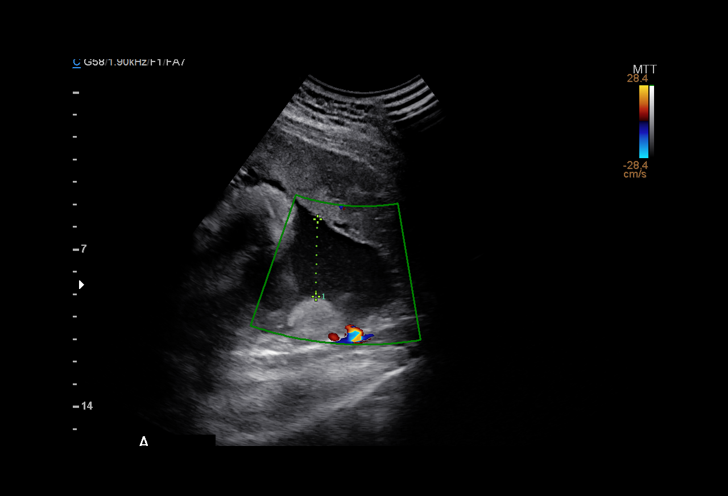
[im 10/43]
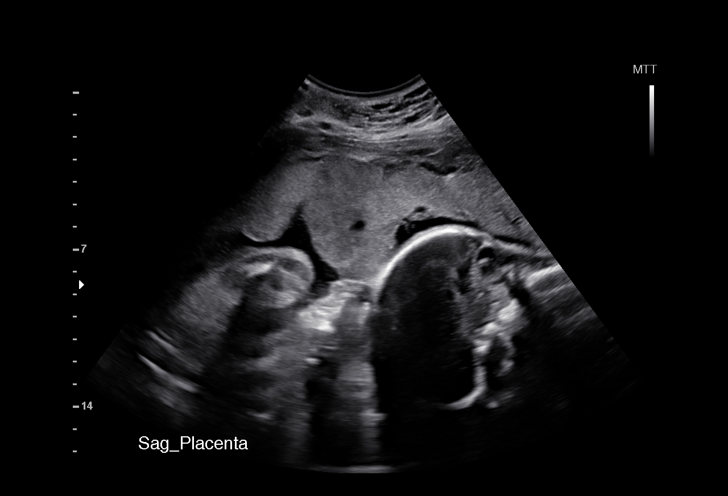
[im 13/43]
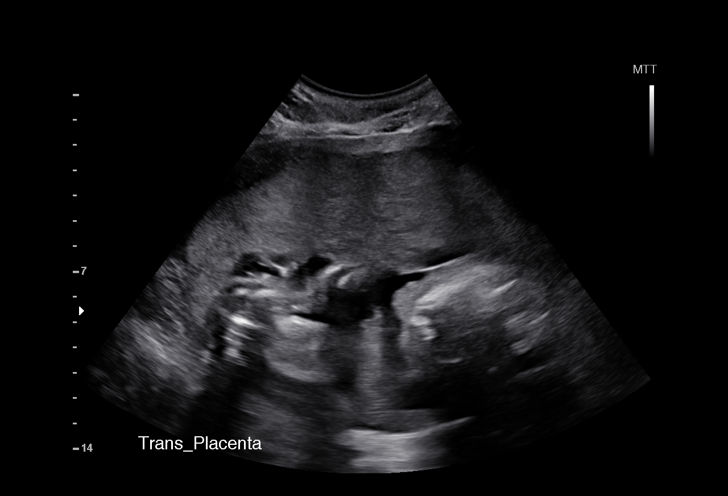
[im 16/43]
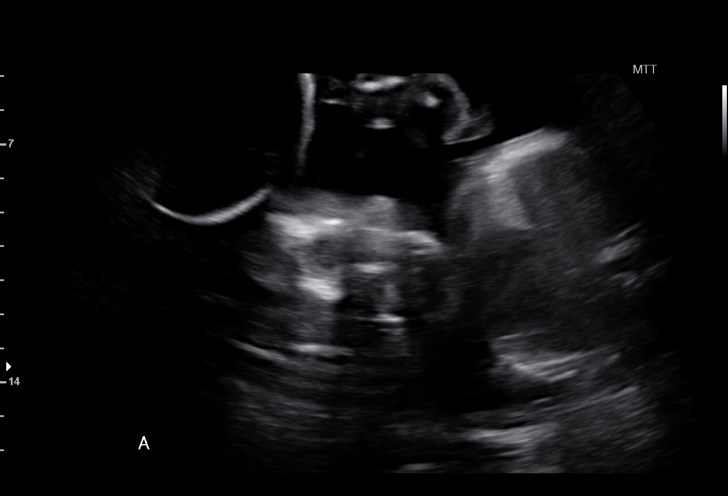
[im 19/43]
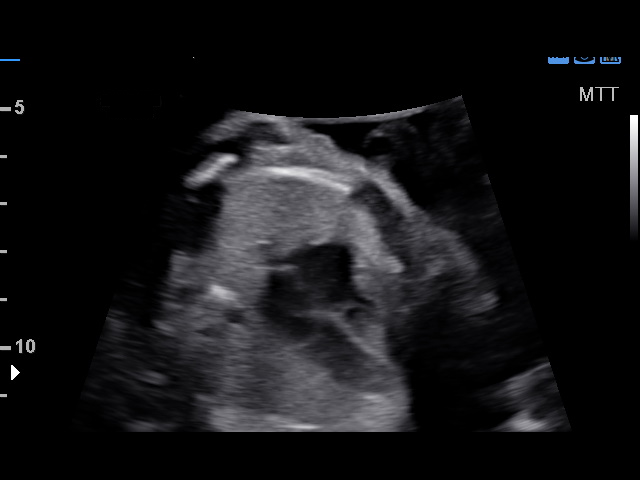
[im 22/43]
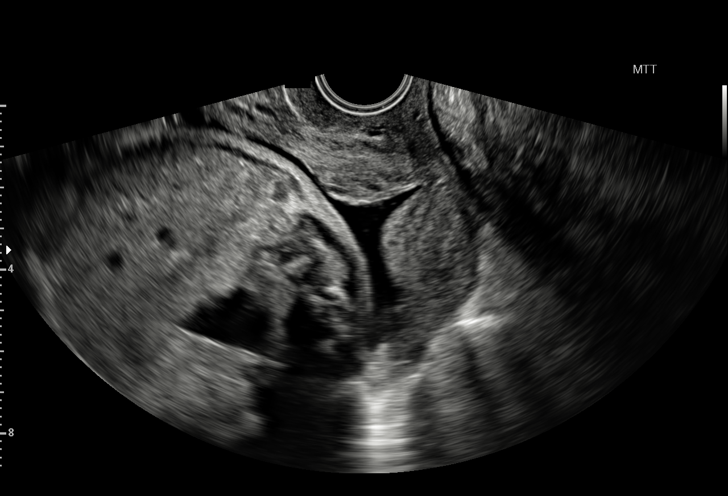
[im 24/43]
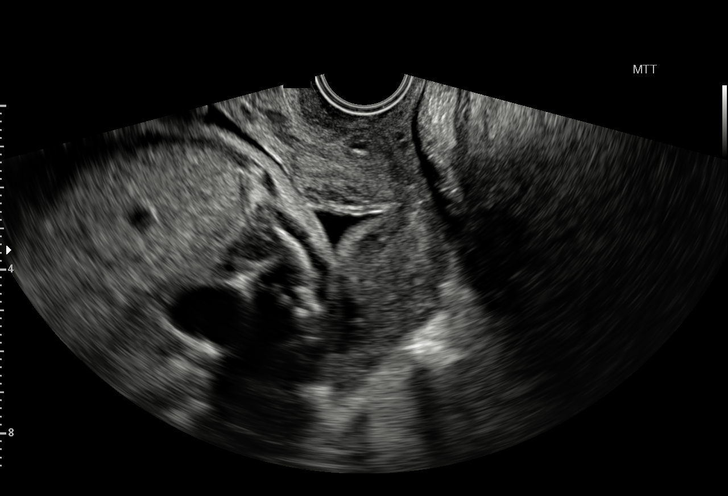
[im 27/43]
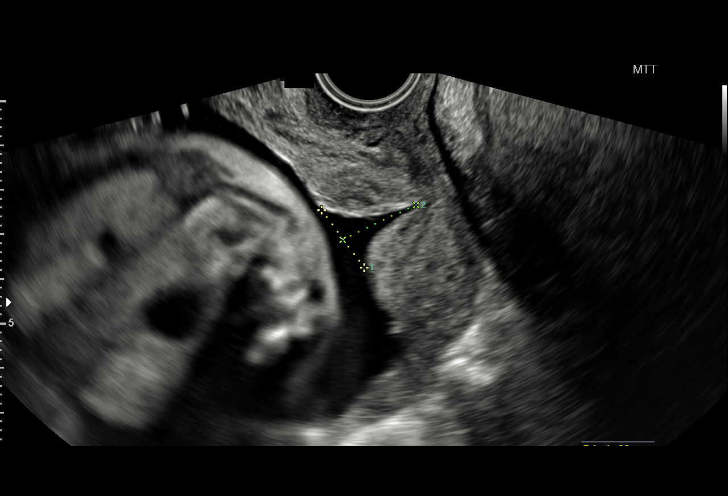
[im 30/43]
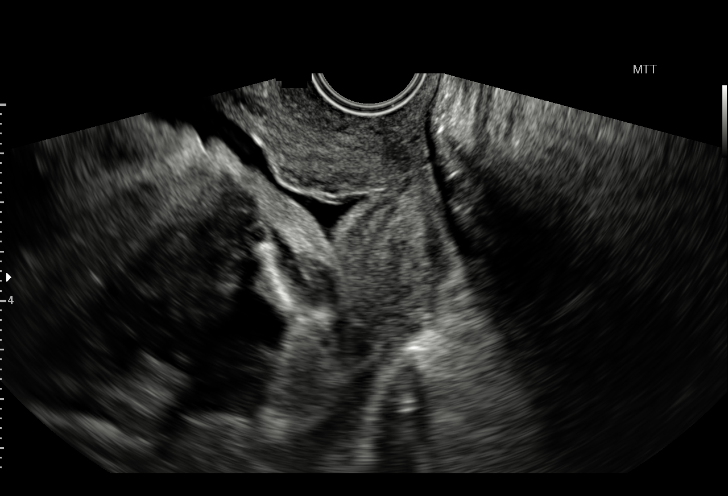
[im 33/43]
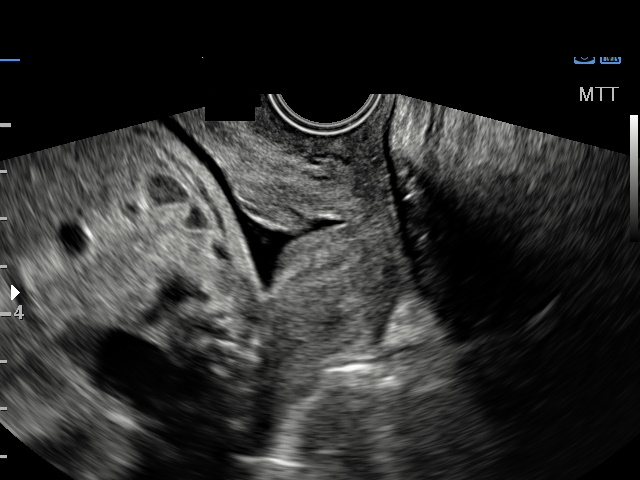
[im 36/43]
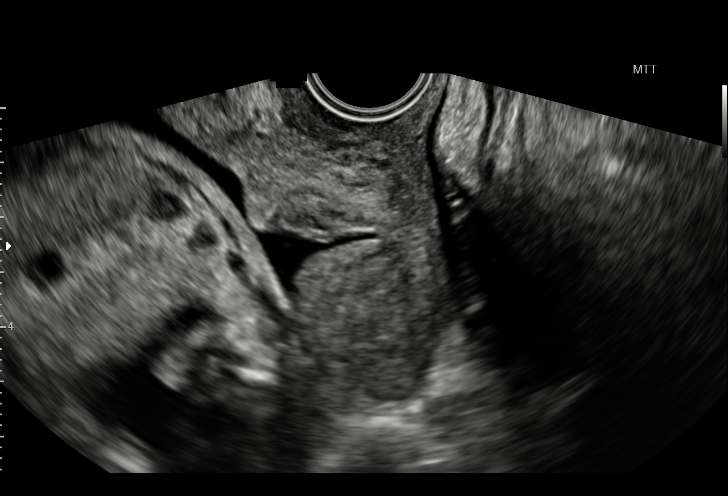
[im 39/43]
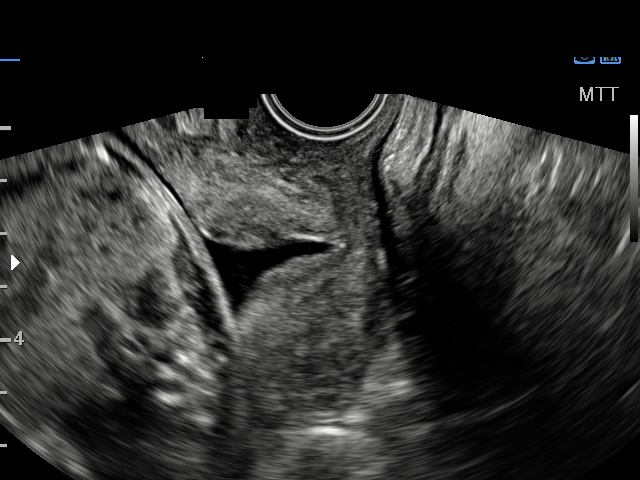
[im 43/43]
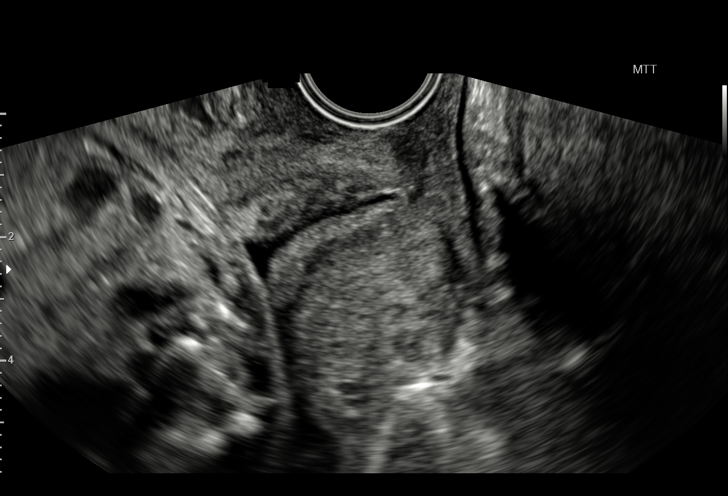

[15 of 28 positions shown; findings below may reference images not displayed]

OBSTETRICS REPORT
(Signed Final 01/25/2015 [DATE])

Service(s) Provided

US MFM OB TRANSVAGINAL                                76817.2
Indications

27 weeks gestation of pregnancy
Twin pregnancy, di/di, second trimester
Abnormal biochemical screen (quad) for T21 (DSR
[DATE]) - NIPS no result x 2
Advanced maternal age primigravida 39, second
trimester
Hypertension - Chronic/Pre-existing (no meds)
Uterine fibroids affecting pregnancy in second        O34.12,
trimester, antepartum
Cervical shortening, second trimester;
progesterone
Fetal Evaluation (Fetus A)

Num Of Fetuses:    2
Cardiac Activity:  Observed
Fetal Lie:         Lower Fetus
Presentation:      Breech
Placenta:          Anterior, above cervical os
P. Cord Insertion: Previously Visualized

Membrane Desc:     Dividing
Membrane seen
- Dichorionic.

Amniotic Fluid
AFI FV:      Subjectively within normal limits
Larg Pckt:    3.4  cm
Gestational Age (Fetus A)

LMP:           27w 1d        Date:  07/19/14                 EDD:    04/25/15
Best:          27w 1d     Det. By:  LMP  (07/19/14)          EDD:    04/25/15

Fetal Evaluation (Fetus B)

Num Of Fetuses:    2
Fetal Heart Rate:  133                          bpm
Cardiac Activity:  Observed
Fetal Lie:         Upper Fetus
Presentation:      Transverse, head to
maternal left
Placenta:          Anterior, above cervical os
P. Cord Insertion: Previously Visualized

Membrane Desc:     Dividing
Membrane seen
- Dichorionic.

Amniotic Fluid
AFI FV:      Subjectively within normal limits
Larg Pckt:    3.0  cm
Gestational Age (Fetus B)

LMP:           27w 1d        Date:  07/19/14                 EDD:    04/25/15
Best:          27w 1d     Det. By:  LMP  (07/19/14)          EDD:    04/25/15
Cervix Uterus Adnexa

Cervical Length:    1.4       cm

Cervix:       Funneling of internal os noted. Measured
transvaginally.
Impression

Dichorionic/diamniotic twin pregnancy at 27+1 weeks
Normal amniotic fluid volume x 2
EV views of cervix: dynamic funneling with CL = 5 to 14 mms
(similar appearance to study at 23 weeks)
Recommendations

Continue vaginal progesterone
Follow cervix clinically
Serial Thusnelda for growth

## 2017-04-01 ENCOUNTER — Emergency Department (HOSPITAL_COMMUNITY)
Admission: EM | Admit: 2017-04-01 | Discharge: 2017-04-01 | Disposition: A | Discharge status: Home / Self Care | Payer: Self-pay | Attending: Emergency Medicine | Admitting: Emergency Medicine

## 2017-04-01 ENCOUNTER — Encounter (HOSPITAL_COMMUNITY): Payer: Self-pay | Admitting: Nurse Practitioner

## 2017-04-01 DIAGNOSIS — R110 Nausea: Secondary | ICD-10-CM | POA: Insufficient documentation

## 2017-04-01 DIAGNOSIS — G8929 Other chronic pain: Secondary | ICD-10-CM | POA: Insufficient documentation

## 2017-04-01 DIAGNOSIS — R51 Headache: Secondary | ICD-10-CM | POA: Insufficient documentation

## 2017-04-01 DIAGNOSIS — M542 Cervicalgia: Secondary | ICD-10-CM | POA: Insufficient documentation

## 2017-04-01 DIAGNOSIS — Z79899 Other long term (current) drug therapy: Secondary | ICD-10-CM | POA: Insufficient documentation

## 2017-04-01 DIAGNOSIS — Z87891 Personal history of nicotine dependence: Secondary | ICD-10-CM | POA: Insufficient documentation

## 2017-04-01 DIAGNOSIS — I10 Essential (primary) hypertension: Secondary | ICD-10-CM | POA: Insufficient documentation

## 2017-04-01 LAB — URINALYSIS, ROUTINE W REFLEX MICROSCOPIC
BACTERIA UA: NONE SEEN
BILIRUBIN URINE: NEGATIVE
Glucose, UA: NEGATIVE mg/dL
KETONES UR: NEGATIVE mg/dL
Leukocytes, UA: NEGATIVE
Nitrite: NEGATIVE
Protein, ur: NEGATIVE mg/dL
SPECIFIC GRAVITY, URINE: 1.013 (ref 1.005–1.030)
pH: 6 (ref 5.0–8.0)

## 2017-04-01 LAB — CBC
HCT: 36.4 % (ref 36.0–46.0)
Hemoglobin: 12.5 g/dL (ref 12.0–15.0)
MCH: 30.6 pg (ref 26.0–34.0)
MCHC: 34.3 g/dL (ref 30.0–36.0)
MCV: 89.2 fL (ref 78.0–100.0)
PLATELETS: 354 10*3/uL (ref 150–400)
RBC: 4.08 MIL/uL (ref 3.87–5.11)
RDW: 13.1 % (ref 11.5–15.5)
WBC: 10.2 10*3/uL (ref 4.0–10.5)

## 2017-04-01 LAB — BASIC METABOLIC PANEL
Anion gap: 9 (ref 5–15)
BUN: 10 mg/dL (ref 6–20)
CALCIUM: 9.3 mg/dL (ref 8.9–10.3)
CHLORIDE: 106 mmol/L (ref 101–111)
CO2: 25 mmol/L (ref 22–32)
CREATININE: 0.58 mg/dL (ref 0.44–1.00)
GFR calc non Af Amer: >60 mL/min (ref >60)
Glucose, Bld: 90 mg/dL (ref 65–99)
Potassium: 3.2 mmol/L — ABNORMAL LOW (ref 3.5–5.1)
SODIUM: 140 mmol/L (ref 135–145)

## 2017-04-01 LAB — I-STAT BETA HCG BLOOD, ED (MC, WL, AP ONLY)

## 2017-04-01 LAB — I-STAT TROPONIN, ED: TROPONIN I, POC: 0 ng/mL (ref 0.00–0.08)

## 2017-04-01 MED ORDER — AMLODIPINE BESYLATE 5 MG PO TABS
5.0000 mg | ORAL_TABLET | Freq: Once | ORAL | Status: AC
  Administered 2017-04-01: 5 mg via ORAL
  Filled 2017-04-01: qty 1

## 2017-04-01 MED ORDER — POTASSIUM CHLORIDE CRYS ER 20 MEQ PO TBCR
40.0000 meq | EXTENDED_RELEASE_TABLET | Freq: Once | ORAL | Status: AC
  Administered 2017-04-01: 40 meq via ORAL
  Filled 2017-04-01: qty 2

## 2017-04-01 MED ORDER — HYDROCHLOROTHIAZIDE 12.5 MG PO CAPS
12.5000 mg | ORAL_CAPSULE | Freq: Once | ORAL | Status: AC
  Administered 2017-04-01: 12.5 mg via ORAL
  Filled 2017-04-01: qty 1

## 2017-04-01 MED ORDER — AMLODIPINE BESYLATE 5 MG PO TABS: 5.0000 mg | ORAL_TABLET | Freq: Every day | ORAL | 0 refills | Status: AC

## 2017-04-01 MED ORDER — OXYCODONE-ACETAMINOPHEN 5-325 MG PO TABS
1.0000 | ORAL_TABLET | Freq: Once | ORAL | Status: AC
  Administered 2017-04-01: 1 via ORAL
  Filled 2017-04-01: qty 1

## 2017-04-01 MED ORDER — METHOCARBAMOL 500 MG PO TABS: 500.0000 mg | ORAL_TABLET | Freq: Two times a day (BID) | ORAL | 0 refills | Status: AC

## 2017-04-01 MED ORDER — HYDROCHLOROTHIAZIDE 12.5 MG PO TABS: 12.5000 mg | ORAL_TABLET | Freq: Every day | ORAL | 0 refills | Status: AC
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
## 2017-04-01 NOTE — ED Triage Notes (Signed)
Pt states her BP has been elavted for the past 2 days, however, today she started having a severe HA,facial numbness, neck pain and nausea. She denies CP, SOB.

## 2017-04-01 NOTE — Discharge Instructions (Addendum)
Your blood work is reassuring today.  Your potassium was mildly decreased, please eat foods high in potassium like bananas.  Please take the two blood pressure medications I have prescribed you daily. You received your first dose in the ER today and do not need your next dose until tomorrow.   Please schedule an appointment with your regular doctor for follow up and further medication management of your blood pressure.   I have written you a prescription for a muscle relaxer medicine called Robaxin.  This medicine can make you drowsy so please do not drive, work or drink alcohol while taking it.  Return to the ER if you have worsening headache, trouble with your vision, vomiting, chest pain or shortness of breath. Please also return for any new or concerning symptoms.

## 2019-01-27 ENCOUNTER — Other Ambulatory Visit (HOSPITAL_COMMUNITY): Payer: Self-pay | Admitting: *Deleted

## 2019-01-27 DIAGNOSIS — Z1231 Encounter for screening mammogram for malignant neoplasm of breast: Secondary | ICD-10-CM

## 2019-03-27 ENCOUNTER — Other Ambulatory Visit (HOSPITAL_COMMUNITY): Payer: Self-pay | Admitting: *Deleted

## 2019-03-27 ENCOUNTER — Ambulatory Visit: Payer: Self-pay

## 2019-03-27 ENCOUNTER — Other Ambulatory Visit: Payer: Self-pay

## 2019-03-27 ENCOUNTER — Encounter (HOSPITAL_COMMUNITY): Payer: Self-pay

## 2019-03-27 ENCOUNTER — Ambulatory Visit (HOSPITAL_COMMUNITY)
Admission: RE | Admit: 2019-03-27 | Discharge: 2019-03-27 | Disposition: A | Discharge status: Home / Self Care | Payer: Medicaid Other | Source: Ambulatory Visit | Attending: Obstetrics and Gynecology | Admitting: Obstetrics and Gynecology

## 2019-03-27 DIAGNOSIS — N6452 Nipple discharge: Secondary | ICD-10-CM | POA: Insufficient documentation

## 2019-03-27 DIAGNOSIS — Z1239 Encounter for other screening for malignant neoplasm of breast: Secondary | ICD-10-CM | POA: Insufficient documentation

## 2019-03-27 HISTORY — DX: Disorder of thyroid, unspecified: E07.9

## 2019-03-27 NOTE — Patient Instructions (Signed)
Explained breast self awareness with Vena R Slee. Patient did not need a Pap smear today due to last Pap smear was in September 2020 per patient. Let her know [facility] will cover Pap smears every 3 years unless has a history of abnormal Pap smears. Referred patient to the [facility] for a diagnostic mammogram. Appointment scheduled for Friday, April 04, 2019 at 0900. Patient aware of appointment and will be there. Sharra R Staffa verbalized understanding.  Aashritha Miedema, Arvil Chaco, RN 9:02 AM

## 2019-03-27 NOTE — Progress Notes (Signed)
Complaints of right nipple discharge when expresses x 8 years.   Pap Smear: Pap smear not completed today. Last Pap smear was in September 2020 at [facility] and normal per patient. Per patient has a history of an abnormal Pap smear when she was 44 years old that a colposcopy and cryotherapy was completed for follow-up. Per patient has had at least three normal Pap smears since colposcopy. No Pap smear results are in Epic.  Physical exam: Breasts Breasts symmetrical. No skin abnormalities bilateral breasts. No nipple retraction bilateral breasts. No nipple discharge bilateral breasts. Unable to express any nipple discharge from the right breast on exam. No lymphadenopathy. No lumps palpated bilateral breasts. No complaints of pain or tenderness on exam. Referred patient to the [facility] for a diagnostic mammogram. Appointment scheduled for Friday, April 04, 2019 at 0900.        Pelvic/Bimanual No Pap smear completed today since last Pap smear was in September 2020 per patient. Pap smear not indicated per [facility]CP guidelines.   Smoking History: Patient has never smoked.  Patient Navigation: Patient education provided. Access to services provided for patient through [facility] program.   Breast and Cervical Cancer Risk Assessment: Patient has no family history of breast cancer, known genetic mutations, or radiation treatment to the chest before age 65. Per patient has a history of cervical dysplasia. Patient has no history of being immunocompromised or DES exposure in-utero.  Risk Assessment    Risk Scores      03/27/2019   Last edited by: Demetrius Revel, LPN   5-year risk: 0.6 %   Lifetime risk: 9.6 %

## 2019-04-04 ENCOUNTER — Ambulatory Visit
Admission: RE | Admit: 2019-04-04 | Discharge: 2019-04-04 | Disposition: A | Discharge status: Home / Self Care | Payer: Medicaid Other | Source: Ambulatory Visit | Attending: Obstetrics and Gynecology | Admitting: Obstetrics and Gynecology

## 2019-04-04 ENCOUNTER — Other Ambulatory Visit (HOSPITAL_COMMUNITY): Payer: Self-pay | Admitting: Obstetrics and Gynecology

## 2019-04-04 ENCOUNTER — Other Ambulatory Visit: Payer: Self-pay

## 2019-04-04 DIAGNOSIS — N6452 Nipple discharge: Secondary | ICD-10-CM

## 2019-04-04 DIAGNOSIS — N6310 Unspecified lump in the right breast, unspecified quadrant: Secondary | ICD-10-CM

## 2019-04-14 ENCOUNTER — Other Ambulatory Visit: Payer: Self-pay

## 2019-04-14 ENCOUNTER — Ambulatory Visit
Admission: RE | Admit: 2019-04-14 | Discharge: 2019-04-14 | Disposition: A | Discharge status: Home / Self Care | Payer: No Typology Code available for payment source | Source: Ambulatory Visit | Attending: Obstetrics and Gynecology | Admitting: Obstetrics and Gynecology

## 2019-04-14 DIAGNOSIS — N6310 Unspecified lump in the right breast, unspecified quadrant: Secondary | ICD-10-CM

## 2020-03-01 ENCOUNTER — Other Ambulatory Visit: Payer: Self-pay | Admitting: Obstetrics and Gynecology

## 2020-03-01 DIAGNOSIS — Z1231 Encounter for screening mammogram for malignant neoplasm of breast: Secondary | ICD-10-CM

## 2020-04-08 ENCOUNTER — Ambulatory Visit: Payer: No Typology Code available for payment source

## 2020-05-21 ENCOUNTER — Ambulatory Visit
Admission: RE | Admit: 2020-05-21 | Discharge: 2020-05-21 | Disposition: A | Discharge status: Home / Self Care | Payer: 59 | Source: Ambulatory Visit | Attending: Obstetrics and Gynecology | Admitting: Obstetrics and Gynecology

## 2020-05-21 ENCOUNTER — Other Ambulatory Visit: Payer: Self-pay

## 2020-05-21 DIAGNOSIS — Z1231 Encounter for screening mammogram for malignant neoplasm of breast: Secondary | ICD-10-CM

## 2020-05-24 ENCOUNTER — Other Ambulatory Visit: Payer: Self-pay | Admitting: Obstetrics and Gynecology

## 2020-05-24 DIAGNOSIS — R928 Other abnormal and inconclusive findings on diagnostic imaging of breast: Secondary | ICD-10-CM

## 2020-05-26 ENCOUNTER — Ambulatory Visit
Admission: RE | Admit: 2020-05-26 | Discharge: 2020-05-26 | Disposition: A | Discharge status: Home / Self Care | Payer: 59 | Source: Ambulatory Visit | Attending: Obstetrics and Gynecology | Admitting: Obstetrics and Gynecology

## 2020-05-26 ENCOUNTER — Ambulatory Visit: Admission: RE | Admit: 2020-05-26 | Payer: 59 | Source: Ambulatory Visit

## 2020-05-26 ENCOUNTER — Other Ambulatory Visit: Payer: Self-pay

## 2020-05-26 DIAGNOSIS — R928 Other abnormal and inconclusive findings on diagnostic imaging of breast: Secondary | ICD-10-CM

## 2022-09-20 ENCOUNTER — Other Ambulatory Visit: Payer: Self-pay | Admitting: Internal Medicine

## 2022-09-20 ENCOUNTER — Ambulatory Visit
Admission: RE | Admit: 2022-09-20 | Discharge: 2022-09-20 | Disposition: A | Discharge status: Home / Self Care | Payer: 59 | Source: Ambulatory Visit | Attending: Internal Medicine | Admitting: Internal Medicine

## 2022-09-20 DIAGNOSIS — R1084 Generalized abdominal pain: Secondary | ICD-10-CM
